# Patient Record
Sex: Female | Born: 1990 | Hispanic: Yes | Marital: Married | Smoking: Never smoker
Health system: Southern US, Community
[De-identification: ages and names within clinical notes are randomized; demographics above are authoritative.]

## PROBLEM LIST (undated history)

## (undated) ENCOUNTER — Inpatient Hospital Stay (HOSPITAL_COMMUNITY): Payer: Self-pay

## (undated) ENCOUNTER — Inpatient Hospital Stay (HOSPITAL_COMMUNITY): Payer: 59

## (undated) DIAGNOSIS — B999 Unspecified infectious disease: Secondary | ICD-10-CM

## (undated) DIAGNOSIS — D62 Acute posthemorrhagic anemia: Secondary | ICD-10-CM

## (undated) DIAGNOSIS — F329 Major depressive disorder, single episode, unspecified: Secondary | ICD-10-CM

## (undated) DIAGNOSIS — F32A Depression, unspecified: Secondary | ICD-10-CM

## (undated) HISTORY — PX: MOLE REMOVAL: SHX2046

## (undated) HISTORY — DX: Major depressive disorder, single episode, unspecified: F32.9

## (undated) HISTORY — DX: Depression, unspecified: F32.A

## (undated) HISTORY — DX: Unspecified infectious disease: B99.9

---

## 2009-06-03 ENCOUNTER — Inpatient Hospital Stay (HOSPITAL_COMMUNITY): Admission: AD | Admit: 2009-06-03 | Discharge: 2009-06-03 | Payer: Self-pay | Admitting: Obstetrics & Gynecology

## 2009-06-05 ENCOUNTER — Inpatient Hospital Stay (HOSPITAL_COMMUNITY): Admission: AD | Admit: 2009-06-05 | Discharge: 2009-06-07 | Payer: Self-pay | Admitting: Obstetrics & Gynecology

## 2009-06-10 ENCOUNTER — Emergency Department (HOSPITAL_COMMUNITY): Admission: EM | Admit: 2009-06-10 | Discharge: 2009-06-10 | Payer: Self-pay | Admitting: Emergency Medicine

## 2010-06-25 ENCOUNTER — Encounter (INDEPENDENT_AMBULATORY_CARE_PROVIDER_SITE_OTHER): Payer: Self-pay | Admitting: Internal Medicine

## 2010-06-25 LAB — CONVERTED CEMR LAB
CO2: 29 meq/L (ref 19–32)
Cholesterol: 140 mg/dL (ref 0–200)
DHEA-SO4: 181 ug/dL (ref 35–430)
Eosinophils Relative: 3 % (ref 0–5)
Glucose, Bld: 80 mg/dL (ref 70–99)
HCT: 40 % (ref 36.0–46.0)
Hemoglobin: 13.2 g/dL (ref 12.0–15.0)
Lymphocytes Relative: 35 % (ref 12–46)
Lymphs Abs: 2.3 10*3/uL (ref 0.7–4.0)
Monocytes Absolute: 0.5 10*3/uL (ref 0.1–1.0)
Preg, Serum: NEGATIVE
RBC: 4.36 M/uL (ref 3.87–5.11)
Sodium: 138 meq/L (ref 135–145)
Testosterone: 48.98 ng/dL (ref 10–70)
Total Bilirubin: 0.4 mg/dL (ref 0.3–1.2)
Total Protein: 7.6 g/dL (ref 6.0–8.3)
Triglycerides: 35 mg/dL (ref <150)
VLDL: 7 mg/dL (ref 0–40)
WBC: 6.4 10*3/uL (ref 4.0–10.5)

## 2010-07-09 ENCOUNTER — Ambulatory Visit (HOSPITAL_COMMUNITY)
Admission: RE | Admit: 2010-07-09 | Discharge: 2010-07-09 | Payer: Self-pay | Source: Home / Self Care | Admitting: Internal Medicine

## 2010-11-25 LAB — CBC
HCT: 35.2 % — ABNORMAL LOW (ref 36.0–46.0)
HCT: 37.6 % (ref 36.0–46.0)
Hemoglobin: 11.9 g/dL — ABNORMAL LOW (ref 12.0–15.0)
MCHC: 33.6 g/dL (ref 30.0–36.0)
MCHC: 33.8 g/dL (ref 30.0–36.0)
MCHC: 33.8 g/dL (ref 30.0–36.0)
MCHC: 34.7 g/dL (ref 30.0–36.0)
MCV: 92.6 fL (ref 78.0–100.0)
MCV: 92.8 fL (ref 78.0–100.0)
MCV: 93.6 fL (ref 78.0–100.0)
Platelets: 217 10*3/uL (ref 150–400)
Platelets: 255 10*3/uL (ref 150–400)
RBC: 3.67 MIL/uL — ABNORMAL LOW (ref 3.87–5.11)
RBC: 3.76 MIL/uL — ABNORMAL LOW (ref 3.87–5.11)
RBC: 3.8 MIL/uL — ABNORMAL LOW (ref 3.87–5.11)
RDW: 12.6 % (ref 11.5–15.5)
RDW: 12.7 % (ref 11.5–15.5)
RDW: 13 % (ref 11.5–15.5)
WBC: 11.2 10*3/uL — ABNORMAL HIGH (ref 4.0–10.5)

## 2010-11-25 LAB — COMPREHENSIVE METABOLIC PANEL
ALT: 13 U/L (ref 0–35)
AST: 19 U/L (ref 0–37)
CO2: 28 mEq/L (ref 19–32)
Calcium: 9.4 mg/dL (ref 8.4–10.5)
Creatinine, Ser: 0.56 mg/dL (ref 0.4–1.2)
GFR calc Af Amer: >60 mL/min (ref >60)
GFR calc non Af Amer: >60 mL/min (ref >60)
Sodium: 136 mEq/L (ref 135–145)
Total Protein: 6.7 g/dL (ref 6.0–8.3)

## 2010-11-25 LAB — DIFFERENTIAL
Basophils Absolute: 0 10*3/uL (ref 0.0–0.1)
Basophils Relative: 0 % (ref 0–1)
Basophils Relative: 1 % (ref 0–1)
Eosinophils Absolute: 1.2 10*3/uL — ABNORMAL HIGH (ref 0.0–0.7)
Eosinophils Absolute: 1.5 10*3/uL — ABNORMAL HIGH (ref 0.0–0.7)
Eosinophils Relative: 11 % — ABNORMAL HIGH (ref 0–5)
Eosinophils Relative: 9 % — ABNORMAL HIGH (ref 0–5)
Lymphocytes Relative: 16 % (ref 12–46)
Lymphocytes Relative: 21 % (ref 12–46)
Lymphocytes Relative: 25 % (ref 12–46)
Lymphs Abs: 2.4 10*3/uL (ref 0.7–4.0)
Lymphs Abs: 2.5 10*3/uL (ref 0.7–4.0)
Lymphs Abs: 2.9 10*3/uL (ref 0.7–4.0)
Monocytes Absolute: 0.9 10*3/uL (ref 0.1–1.0)
Monocytes Relative: 7 % (ref 3–12)
Monocytes Relative: 8 % (ref 3–12)
Monocytes Relative: 9 % (ref 3–12)
Neutro Abs: 5.9 10*3/uL (ref 1.7–7.7)
Neutro Abs: 7.5 10*3/uL (ref 1.7–7.7)
Neutrophils Relative %: 53 % (ref 43–77)

## 2010-11-25 LAB — BASIC METABOLIC PANEL
BUN: 7 mg/dL (ref 6–23)
CO2: 24 mEq/L (ref 19–32)
Calcium: 9.5 mg/dL (ref 8.4–10.5)
GFR calc non Af Amer: >60 mL/min (ref >60)
Glucose, Bld: 84 mg/dL (ref 70–99)

## 2010-11-25 LAB — URINALYSIS, ROUTINE W REFLEX MICROSCOPIC
Bilirubin Urine: NEGATIVE
Glucose, UA: NEGATIVE mg/dL
Ketones, ur: >80 mg/dL — AB
Nitrite: NEGATIVE
Nitrite: NEGATIVE
Protein, ur: NEGATIVE mg/dL
Specific Gravity, Urine: 1.015 (ref 1.005–1.030)
Urobilinogen, UA: 1 mg/dL (ref 0.0–1.0)
pH: 6 (ref 5.0–8.0)
pH: 7.5 (ref 5.0–8.0)

## 2010-11-25 LAB — GC/CHLAMYDIA PROBE AMP, GENITAL
Chlamydia, DNA Probe: NEGATIVE
GC Probe Amp, Genital: NEGATIVE

## 2010-11-25 LAB — URINE MICROSCOPIC-ADD ON

## 2010-11-25 LAB — WET PREP, GENITAL: Trich, Wet Prep: NONE SEEN

## 2010-11-25 LAB — URINE CULTURE
Colony Count: >=100000
Colony Count: NO GROWTH
Culture: NO GROWTH

## 2010-11-25 LAB — HCG, QUANTITATIVE, PREGNANCY: hCG, Beta Chain, Quant, S: 24637 m[IU]/mL — ABNORMAL HIGH (ref <5)

## 2013-12-30 ENCOUNTER — Inpatient Hospital Stay (HOSPITAL_COMMUNITY)
Admission: AD | Admit: 2013-12-30 | Discharge: 2013-12-30 | Disposition: A | Discharge status: Home / Self Care | Payer: 59 | Source: Ambulatory Visit | Attending: Family Medicine | Admitting: Family Medicine

## 2013-12-30 ENCOUNTER — Encounter (HOSPITAL_COMMUNITY): Payer: Self-pay | Admitting: *Deleted

## 2013-12-30 DIAGNOSIS — Z3202 Encounter for pregnancy test, result negative: Secondary | ICD-10-CM

## 2013-12-30 DIAGNOSIS — R1032 Left lower quadrant pain: Secondary | ICD-10-CM | POA: Insufficient documentation

## 2013-12-30 LAB — HCG, QUANTITATIVE, PREGNANCY: hCG, Beta Chain, Quant, S: <1 m[IU]/mL (ref <5)

## 2013-12-30 LAB — POCT PREGNANCY, URINE: Preg Test, Ur: NEGATIVE

## 2013-12-30 NOTE — MAU Provider Note (Signed)
Ms. Brooke Howard is a 23 y.o. female who presents to MAU today for a pregnancy test. The patient states that she has Mirena IUD. She takes HPT every few months because she does not have periods with IUD. She states that she took HPT this week and it was positive. She went to PCP and had negative UPT, but complained of mild LLQ cramping. She was sent there for further evaluation. She states mild pain off and on. She denies vaginal discharge, bleeding or UTI symptoms. She declines pelvic exam today.   BP 122/73  Pulse 89  Temp(Src) 98.3 F (36.8 C) (Oral)  Resp 16  Ht 5\' 4"  (1.626 m)  Wt 141 lb 12.8 oz (64.32 kg)  BMI 24.33 kg/m2 GENERAL: Well-developed, well-nourished female in no acute distress.  HEENT: Normocephalic, atraumatic.   LUNGS: Effort normal HEART: Regular rate  SKIN: Warm, dry and without erythema PSYCH: Normal mood and affect  Results for orders placed during the hospital encounter of 12/30/13 (from the past 24 hour(s))  HCG, QUANTITATIVE, PREGNANCY     Status: None   Collection Time    12/30/13  8:45 PM      Result Value Ref Range   hCG, Beta Chain, Quant, S <1  <5 mIU/mL  POCT PREGNANCY, URINE     Status: None   Collection Time    12/30/13  8:48 PM      Result Value Ref Range   Preg Test, Ur NEGATIVE  NEGATIVE   MDM Patient offered further evaluation of lower abdominal cramping. Declines at this time.   A: Negative pregnancy test  P: Discharge home Follow-up with PCP if abdominal pain continues Patient may take Ibuprofen PRN for pain Patient may return to MAU as needed or if her condition were to change or worsen  Freddi StarrJulie N Ethier, PA-C 12/30/2013 9:50 PM

## 2013-12-30 NOTE — Discharge Instructions (Signed)
Pregnancy Tests  HOW DO PREGNANCY TESTS WORK?  All pregnancy tests look for a special hormone in the urine or blood that is only present in pregnant women. This hormone, human chorionic gonadotropin (hCG), is also called the pregnancy hormone.   WHAT IS THE DIFFERENCE BETWEEN A URINE AND A BLOOD PREGNANCY TEST? IS ONE BETTER THAN THE OTHER?  There are two types of pregnancy tests.  · Blood tests.  · Urine tests.  Both tests look for the presence of hCG, the pregnancy hormone. Many women use a urine test or home pregnancy test (HPT) to find out if they are pregnant. HPTs are cheap, easy to use, can be done at home, and are private. When a woman has a positive result on an HPT, she needs to see her caregiver right away. The caregiver can confirm a positive HPT result with another urine test, a blood test, ultrasound, and a pelvic exam.   There are two types of blood tests you can get from a caregiver.   · A quantitative blood test (or the beta hCG test). This test measures the exact amount of hCG in the blood. This means it can pick up very small amounts of hCG, making it a very accurate test.  · A qualitative hCG blood test. This test gives a simple yes or no answer to whether you are pregnant. This test is more like a urine test in terms of its accuracy.  Blood tests can pick up hCG earlier in a pregnancy than urine tests can. Blood tests can tell if you are pregnant about 6 to 8 days after you release an egg from an ovary (ovulate). Urine tests can determine pregnancy about 2 weeks after ovulation.   HOW IS A HOME PREGNANCY TEST DONE?   There are many types of home pregnancy tests or HPTs that can be bought over-the-counter at drug or discount stores.   · Some involve collecting your urine in a cup and dipping a stick into the urine or putting some of the urine into a special container with an eyedropper.  · Others are done by placing a stick into your urine stream.  · Tests vary in how long you need to wait for  the stick or container to turn a certain color or have a symbol on it (like a plus or a minus).  · All tests come with written instructions. Most tests also have toll-free phone numbers to call if you have any questions about how to do the test or read the results.  HOW ACCURATE ARE HOME PREGNANCY TESTS?   HPTs are very accurate. Most brands of HPTs say they are 97% to 99% accurate when taken 1 week after missing your menstrual period, but this can vary with actual use. Each brand varies in how sensitive it is in picking up the pregnancy hormone hCG. If a test is not done correctly, it will be less accurate. Always check the package to make sure it is not past its expiration date. If it is, it will not be accurate. Most brands of HPTs tell users to do the test again in a few days, no matter what the results.   If you use an HPT too early in your pregnancy, you may not have enough of the pregnancy hormone hCG in your urine to have a positive test result. Most HPTs will be accurate if you test yourself around the time your period is due (about 2 weeks after you ovulate). You can   get a negative test result if you are not pregnant or if you ovulated later than you thought you did. You may also have problems with the pregnancy, which affects the amount of hCG you have in your urine. If your HPT is negative, test yourself again within a few days to 1 week. If you keep getting a negative result and think you are pregnant, talk with your caregiver right away about getting a blood pregnancy test.   FALSE POSITIVE PREGNANCY TEST  A false positive HPT can happen if there is blood or protein present in your urine. A false positive can also happen if you were recently pregnant or if you take a pregnancy test too soon after taking fertility drug that contains hCG. Also, some prescription medicines such as water pills (diuretics), tranquilizers, seizure medicines, psychiatric medicines, and allergy and nausea medicines  (promethazine) give false positive readings.  FALSE NEGATIVE PREGNANCY TEST  · A false negative HPT can happen if you do the test too early. Try to wait until you are at least 1 day late for your menstrual period.  · It may happen if you wait too long to test the urine (longer than 15 minutes).  · It may also happen if the urine is too diluted because you drank a lot of fluids before getting the urine sample. It is best to test the first morning urine after you get out of bed.  If your menstrual period did not start after a week of a negative HPT, repeat the pregnancy test.  CAN ANYTHING INTERFERE WITH HOME PREGNANCY TEST RESULTS?   Most medicines, both over-the-counter and prescription drugs, including birth control pills and antibiotics, should not affect the results of a HPT. Only those drugs that have the pregnancy hormone hCG in them can give a false positive test result. Drugs that have hCG in them may be used for treating infertility (not being able to get pregnant). Alcohol and illegal drugs do not affect HPT results, but you should not be using these substances if you are trying to get pregnant. If you have a positive pregnancy test, call your caregiver to make an appointment to begin prenatal care.  Document Released: 08/11/2003 Document Revised: 10/31/2011 Document Reviewed: 10/22/2010  ExitCare® Patient Information ©2014 [employer].

## 2013-12-30 NOTE — MAU Provider Note (Signed)
Attestation of Attending Supervision of Advanced Practitioner (PA/CNM/NP): Evaluation and management procedures were performed by the Advanced Practitioner under my supervision and collaboration.  I have reviewed the Advanced Practitioner's note and chart, and I agree with the management and plan.  Reva Boresanya S Delva Derden, MD [facility] Attending 12/30/2013 11:50 PM

## 2013-12-30 NOTE — MAU Note (Signed)
Pt has mirena IUD, +UPT at home and - UPT in the office today.  Pt having LLQ pain.  Denies bleeding.

## 2014-05-28 LAB — OB RESULTS CONSOLE ABO/RH: RH TYPE: POSITIVE

## 2014-05-28 LAB — OB RESULTS CONSOLE RPR: RPR: NONREACTIVE

## 2014-05-28 LAB — OB RESULTS CONSOLE HEPATITIS B SURFACE ANTIGEN: HEP B S AG: NEGATIVE

## 2014-05-28 LAB — OB RESULTS CONSOLE ANTIBODY SCREEN: Antibody Screen: NEGATIVE

## 2014-05-28 LAB — OB RESULTS CONSOLE HIV ANTIBODY (ROUTINE TESTING): HIV: NONREACTIVE

## 2014-05-28 LAB — OB RESULTS CONSOLE RUBELLA ANTIBODY, IGM: Rubella: IMMUNE

## 2014-06-12 LAB — OB RESULTS CONSOLE GC/CHLAMYDIA
Chlamydia: NEGATIVE
Gonorrhea: NEGATIVE

## 2014-08-22 NOTE — L&D Delivery Note (Signed)
Delivery Note At 4:58 PM a viable and healthy female was delivered via Vaginal, Spontaneous Delivery (Presentation: Left Occiput Anterior).  APGAR: 8, 9; weight 7 lb 11.1 oz (3490 g).   Placenta status: Intact, Spontaneous.  Cord: 3 vessels with the following complications: None.  Cord pH: na  Anesthesia: Epidural  Episiotomy: None Lacerations: 2nd degree Suture Repair: 2.0 vicryl rapide Est. Blood Loss (mL): 400  Mom to postpartum.  Baby to Couplet care / Skin to Skin.  Dajane Valli J 01/06/2015, 8:58 PM

## 2015-01-05 ENCOUNTER — Encounter (HOSPITAL_COMMUNITY): Payer: Self-pay | Admitting: *Deleted

## 2015-01-05 ENCOUNTER — Inpatient Hospital Stay (HOSPITAL_COMMUNITY)
Admission: AD | Admit: 2015-01-05 | Discharge: 2015-01-05 | Disposition: A | Discharge status: Home / Self Care | Payer: 59 | Source: Ambulatory Visit | Attending: Obstetrics & Gynecology | Admitting: Obstetrics & Gynecology

## 2015-01-05 DIAGNOSIS — Z3A39 39 weeks gestation of pregnancy: Secondary | ICD-10-CM | POA: Diagnosis present

## 2015-01-05 DIAGNOSIS — K219 Gastro-esophageal reflux disease without esophagitis: Secondary | ICD-10-CM | POA: Diagnosis present

## 2015-01-05 DIAGNOSIS — O9962 Diseases of the digestive system complicating childbirth: Principal | ICD-10-CM | POA: Diagnosis present

## 2015-01-05 DIAGNOSIS — O9903 Anemia complicating the puerperium: Secondary | ICD-10-CM | POA: Diagnosis present

## 2015-01-05 MED ORDER — OXYCODONE-ACETAMINOPHEN 5-325 MG PO TABS
2.0000 | ORAL_TABLET | Freq: Once | ORAL | Status: AC
  Administered 2015-01-05: 2 via ORAL
  Filled 2015-01-05: qty 2

## 2015-01-05 NOTE — Discharge Instructions (Signed)
Fetal Movement Counts °Patient Name: __________________________________________________ Patient Due Date: ____________________ °Performing a fetal movement count is highly recommended in high-risk pregnancies, but it is good for every pregnant woman to do. Your health care provider may ask you to start counting fetal movements at 28 weeks of the pregnancy. Fetal movements often increase: °· After eating a full meal. °· After physical activity. °· After eating or drinking something sweet or cold. °· At rest. °Pay attention to when you feel the baby is most active. This will help you notice a pattern of your baby's sleep and wake cycles and what factors contribute to an increase in fetal movement. It is important to perform a fetal movement count at the same time each day when your baby is normally most active.  °HOW TO COUNT FETAL MOVEMENTS °1. Find a quiet and comfortable area to sit or lie down on your left side. Lying on your left side provides the best blood and oxygen circulation to your baby. °2. Write down the day and time on a sheet of paper or in a journal. °3. Start counting kicks, flutters, swishes, rolls, or jabs in a 2-hour period. You should feel at least 10 movements within 2 hours. °4. If you do not feel 10 movements in 2 hours, wait 2-3 hours and count again. Look for a change in the pattern or not enough counts in 2 hours. °SEEK MEDICAL CARE IF: °· You feel less than 10 counts in 2 hours, tried twice. °· There is no movement in over an hour. °· The pattern is changing or taking longer each day to reach 10 counts in 2 hours. °· You feel the baby is not moving as he or she usually does. °Date: ____________ Movements: ____________ Start time: ____________ Finish time: ____________  °Date: ____________ Movements: ____________ Start time: ____________ Finish time: ____________ °Date: ____________ Movements: ____________ Start time: ____________ Finish time: ____________ °Date: ____________ Movements:  ____________ Start time: ____________ Finish time: ____________ °Date: ____________ Movements: ____________ Start time: ____________ Finish time: ____________ °Date: ____________ Movements: ____________ Start time: ____________ Finish time: ____________ °Date: ____________ Movements: ____________ Start time: ____________ Finish time: ____________ °Date: ____________ Movements: ____________ Start time: ____________ Finish time: ____________  °Date: ____________ Movements: ____________ Start time: ____________ Finish time: ____________ °Date: ____________ Movements: ____________ Start time: ____________ Finish time: ____________ °Date: ____________ Movements: ____________ Start time: ____________ Finish time: ____________ °Date: ____________ Movements: ____________ Start time: ____________ Finish time: ____________ °Date: ____________ Movements: ____________ Start time: ____________ Finish time: ____________ °Date: ____________ Movements: ____________ Start time: ____________ Finish time: ____________ °Date: ____________ Movements: ____________ Start time: ____________ Finish time: ____________  °Date: ____________ Movements: ____________ Start time: ____________ Finish time: ____________ °Date: ____________ Movements: ____________ Start time: ____________ Finish time: ____________ °Date: ____________ Movements: ____________ Start time: ____________ Finish time: ____________ °Date: ____________ Movements: ____________ Start time: ____________ Finish time: ____________ °Date: ____________ Movements: ____________ Start time: ____________ Finish time: ____________ °Date: ____________ Movements: ____________ Start time: ____________ Finish time: ____________ °Date: ____________ Movements: ____________ Start time: ____________ Finish time: ____________  °Date: ____________ Movements: ____________ Start time: ____________ Finish time: ____________ °Date: ____________ Movements: ____________ Start time: ____________ Finish  time: ____________ °Date: ____________ Movements: ____________ Start time: ____________ Finish time: ____________ °Date: ____________ Movements: ____________ Start time: ____________ Finish time: ____________ °Date: ____________ Movements: ____________ Start time: ____________ Finish time: ____________ °Date: ____________ Movements: ____________ Start time: ____________ Finish time: ____________ °Date: ____________ Movements: ____________ Start time: ____________ Finish time: ____________  °Date: ____________ Movements: ____________ Start time: ____________ Finish   time: ____________ °Date: ____________ Movements: ____________ Start time: ____________ Finish time: ____________ °Date: ____________ Movements: ____________ Start time: ____________ Finish time: ____________ °Date: ____________ Movements: ____________ Start time: ____________ Finish time: ____________ °Date: ____________ Movements: ____________ Start time: ____________ Finish time: ____________ °Date: ____________ Movements: ____________ Start time: ____________ Finish time: ____________ °Date: ____________ Movements: ____________ Start time: ____________ Finish time: ____________  °Date: ____________ Movements: ____________ Start time: ____________ Finish time: ____________ °Date: ____________ Movements: ____________ Start time: ____________ Finish time: ____________ °Date: ____________ Movements: ____________ Start time: ____________ Finish time: ____________ °Date: ____________ Movements: ____________ Start time: ____________ Finish time: ____________ °Date: ____________ Movements: ____________ Start time: ____________ Finish time: ____________ °Date: ____________ Movements: ____________ Start time: ____________ Finish time: ____________ °Date: ____________ Movements: ____________ Start time: ____________ Finish time: ____________  °Date: ____________ Movements: ____________ Start time: ____________ Finish time: ____________ °Date: ____________  Movements: ____________ Start time: ____________ Finish time: ____________ °Date: ____________ Movements: ____________ Start time: ____________ Finish time: ____________ °Date: ____________ Movements: ____________ Start time: ____________ Finish time: ____________ °Date: ____________ Movements: ____________ Start time: ____________ Finish time: ____________ °Date: ____________ Movements: ____________ Start time: ____________ Finish time: ____________ °Date: ____________ Movements: ____________ Start time: ____________ Finish time: ____________  °Date: ____________ Movements: ____________ Start time: ____________ Finish time: ____________ °Date: ____________ Movements: ____________ Start time: ____________ Finish time: ____________ °Date: ____________ Movements: ____________ Start time: ____________ Finish time: ____________ °Date: ____________ Movements: ____________ Start time: ____________ Finish time: ____________ °Date: ____________ Movements: ____________ Start time: ____________ Finish time: ____________ °Date: ____________ Movements: ____________ Start time: ____________ Finish time: ____________ °Document Released: 09/07/2006 Document Revised: 12/23/2013 Document Reviewed: 06/04/2012 °ExitCare® Patient Information ©2015 [employer]. This information is not intended to replace advice given to you by your health care provider. Make sure you discuss any questions you have with your health care provider. °Braxton Hicks Contractions °Contractions of the uterus can occur throughout pregnancy. Contractions are not always a sign that you are in labor.  °WHAT ARE BRAXTON HICKS CONTRACTIONS?  °Contractions that occur before labor are called Braxton Hicks contractions, or false labor. Toward the end of pregnancy (32-34 weeks), these contractions can develop more often and may become more forceful. This is not true labor because these contractions do not result in opening (dilatation) and thinning of the cervix. They  are sometimes difficult to tell apart from true labor because these contractions can be forceful and people have different pain tolerances. You should not feel embarrassed if you go to the hospital with false labor. Sometimes, the only way to tell if you are in true labor is for your health care provider to look for changes in the cervix. °If there are no prenatal problems or other health problems associated with the pregnancy, it is completely safe to be sent home with false labor and await the onset of true labor. °HOW CAN YOU TELL THE DIFFERENCE BETWEEN TRUE AND FALSE LABOR? °False Labor °· The contractions of false labor are usually shorter and not as hard as those of true labor.   °· The contractions are usually irregular.   °· The contractions are often felt in the front of the lower abdomen and in the groin.   °· The contractions may go away when you walk around or change positions while lying down.   °· The contractions get weaker and are shorter lasting as time goes on.   °· The contractions do not usually become progressively stronger, regular, and closer together as with true labor.   °True Labor °5. Contractions in true labor last 30-70 seconds, become   very regular, usually become more intense, and increase in frequency.   °6. The contractions do not go away with walking.   °7. The discomfort is usually felt in the top of the uterus and spreads to the lower abdomen and low back.   °8. True labor can be determined by your health care provider with an exam. This will show that the cervix is dilating and getting thinner.   °WHAT TO REMEMBER °· Keep up with your usual exercises and follow other instructions given by your health care provider.   °· Take medicines as directed by your health care provider.   °· Keep your regular prenatal appointments.   °· Eat and drink lightly if you think you are going into labor.   °· If Braxton Hicks contractions are making you uncomfortable:   °· Change your position from  lying down or resting to walking, or from walking to resting.   °· Sit and rest in a tub of warm water.   °· Drink 2-3 glasses of water. Dehydration may cause these contractions.   °· Do slow and deep breathing several times an hour.   °WHEN SHOULD I SEEK IMMEDIATE MEDICAL CARE? °Seek immediate medical care if: °· Your contractions become stronger, more regular, and closer together.   °· You have fluid leaking or gushing from your vagina.   °· You have a fever.   °· You pass blood-tinged mucus.   °· You have vaginal bleeding.   °· You have continuous abdominal pain.   °· You have low back pain that you never had before.   °· You feel your baby's head pushing down and causing pelvic pressure.   °· Your baby is not moving as much as it used to.   °Document Released: 08/08/2005 Document Revised: 08/13/2013 Document Reviewed: 05/20/2013 °ExitCare® Patient Information ©2015 [employer]. This information is not intended to replace advice given to you by your health care provider. Make sure you discuss any questions you have with your health care provider. ° °

## 2015-01-05 NOTE — Progress Notes (Signed)
Pt may be discharged home or walk and be rechecked per Dr. Juliene PinaMody

## 2015-01-05 NOTE — MAU Note (Signed)
Pt reports she is having ctx q 5 min. Denies SROM or leaking and reports good fetal movement

## 2015-01-05 NOTE — MAU Note (Signed)
Pt here for labor eval, states ctx's are stronger now. Was seen in MAU this am and was 1.5cm dilated. No bleeding or lof.

## 2015-01-05 NOTE — MAU Note (Signed)
Pt reports UC's since 1500, Denies LOF or Bleeding. +FM

## 2015-01-05 NOTE — Progress Notes (Signed)
Notified of pt's cervical exam unchanged, efm tracing reactive, orders to give pain meds and d/c home.

## 2015-01-05 NOTE — Discharge Instructions (Signed)
You do not need to go to your scheduled appointment this morning. If you have not gone into labor in the next few days, the office will call you to determine the plan of care.    Third Trimester of Pregnancy The third trimester is from week 29 through week 42, months 7 through 9. This trimester is when your unborn baby (fetus) is growing very fast. At the end of the ninth month, the unborn baby is about 20 inches in length. It weighs about 6-10 pounds.  HOME CARE   Avoid all smoking, herbs, and alcohol. Avoid drugs not approved by your doctor.  Only take medicine as told by your doctor. Some medicines are safe and some are not during pregnancy.  Exercise only as told by your doctor. Stop exercising if you start having cramps.  Eat regular, healthy meals.  Wear a good support bra if your breasts are tender.  Do not use hot tubs, steam rooms, or saunas.  Wear your seat belt when driving.  Avoid raw meat, uncooked cheese, and liter boxes and soil used by cats.  Take your prenatal vitamins.  Try taking medicine that helps you poop (stool softener) as needed, and if your doctor approves. Eat more fiber by eating fresh fruit, vegetables, and whole grains. Drink enough fluids to keep your pee (urine) clear or pale yellow.  Take warm water baths (sitz baths) to soothe pain or discomfort caused by hemorrhoids. Use hemorrhoid cream if your doctor approves.  If you have puffy, bulging veins (varicose veins), wear support hose. Raise (elevate) your feet for 15 minutes, 3-4 times a day. Limit salt in your diet.  Avoid heavy lifting, wear low heels, and sit up straight.  Rest with your legs raised if you have leg cramps or low back pain.  Visit your dentist if you have not gone during your pregnancy. Use a soft toothbrush to brush your teeth. Be gentle when you floss.  You can have sex (intercourse) unless your doctor tells you not to.  Do not travel far distances unless you must. Only do so  with your doctor's approval.  Take prenatal classes.  Practice driving to the hospital.  Pack your hospital bag.  Prepare the baby's room.  Go to your doctor visits. GET HELP IF:  You are not sure if you are in labor or if your water has broken.  You are dizzy.  You have mild cramps or pressure in your lower belly (abdominal).  You have a nagging pain in your belly area.  You continue to feel sick to your stomach (nauseous), throw up (vomit), or have watery poop (diarrhea).  You have bad smelling fluid coming from your vagina.  You have pain with peeing (urination). GET HELP RIGHT AWAY IF:   You have a fever.  You are leaking fluid from your vagina.  You are spotting or bleeding from your vagina.  You have severe belly cramping or pain.  You lose or gain weight rapidly.  You have trouble catching your breath and have chest pain.  You notice sudden or extreme puffiness (swelling) of your face, hands, ankles, feet, or legs.  You have not felt the baby move in over an hour.  You have severe headaches that do not go away with medicine.  You have vision changes. Document Released: 11/02/2009 Document Revised: 12/03/2012 Document Reviewed: 10/09/2012 [facility] Patient Information 2015 [employer] [place]. This information is not intended to replace advice given to you by your health care provider.  Make sure you discuss any questions you have with your health care provider. ° °

## 2015-01-06 ENCOUNTER — Inpatient Hospital Stay (HOSPITAL_COMMUNITY): Payer: 59 | Admitting: Anesthesiology

## 2015-01-06 ENCOUNTER — Inpatient Hospital Stay (HOSPITAL_COMMUNITY)
Admission: RE | Admit: 2015-01-06 | Discharge: 2015-01-08 | DRG: 775 | Disposition: A | Discharge status: Home / Self Care | Payer: 59 | Source: Ambulatory Visit | Attending: Obstetrics and Gynecology | Admitting: Obstetrics and Gynecology

## 2015-01-06 ENCOUNTER — Encounter (HOSPITAL_COMMUNITY): Payer: Self-pay | Admitting: *Deleted

## 2015-01-06 DIAGNOSIS — Z3403 Encounter for supervision of normal first pregnancy, third trimester: Secondary | ICD-10-CM | POA: Diagnosis present

## 2015-01-06 DIAGNOSIS — D509 Iron deficiency anemia, unspecified: Secondary | ICD-10-CM | POA: Diagnosis present

## 2015-01-06 DIAGNOSIS — D62 Acute posthemorrhagic anemia: Secondary | ICD-10-CM | POA: Diagnosis not present

## 2015-01-06 DIAGNOSIS — Z3A39 39 weeks gestation of pregnancy: Secondary | ICD-10-CM | POA: Diagnosis present

## 2015-01-06 DIAGNOSIS — IMO0001 Reserved for inherently not codable concepts without codable children: Secondary | ICD-10-CM

## 2015-01-06 DIAGNOSIS — O9903 Anemia complicating the puerperium: Secondary | ICD-10-CM | POA: Diagnosis present

## 2015-01-06 DIAGNOSIS — O99019 Anemia complicating pregnancy, unspecified trimester: Secondary | ICD-10-CM

## 2015-01-06 DIAGNOSIS — K219 Gastro-esophageal reflux disease without esophagitis: Secondary | ICD-10-CM | POA: Diagnosis present

## 2015-01-06 DIAGNOSIS — O9962 Diseases of the digestive system complicating childbirth: Secondary | ICD-10-CM | POA: Diagnosis present

## 2015-01-06 HISTORY — DX: Acute posthemorrhagic anemia: D62

## 2015-01-06 LAB — CBC
HCT: 31.1 % — ABNORMAL LOW (ref 36.0–46.0)
HEMOGLOBIN: 10.2 g/dL — AB (ref 12.0–15.0)
MCH: 27.3 pg (ref 26.0–34.0)
MCHC: 32.8 g/dL (ref 30.0–36.0)
MCV: 83.2 fL (ref 78.0–100.0)
Platelets: 266 10*3/uL (ref 150–400)
RBC: 3.74 MIL/uL — ABNORMAL LOW (ref 3.87–5.11)
RDW: 13.6 % (ref 11.5–15.5)
WBC: 10.9 10*3/uL — ABNORMAL HIGH (ref 4.0–10.5)

## 2015-01-06 LAB — ABO/RH: ABO/RH(D): B POS

## 2015-01-06 LAB — TYPE AND SCREEN
ABO/RH(D): B POS
Antibody Screen: NEGATIVE

## 2015-01-06 LAB — OB RESULTS CONSOLE GBS: STREP GROUP B AG: NEGATIVE

## 2015-01-06 LAB — RPR: RPR: NONREACTIVE

## 2015-01-06 MED ORDER — CITRIC ACID-SODIUM CITRATE 334-500 MG/5ML PO SOLN: 30.0000 mL | ORAL | Status: DC | PRN

## 2015-01-06 MED ORDER — DIPHENHYDRAMINE HCL 25 MG PO CAPS: 25.0000 mg | ORAL_CAPSULE | Freq: Four times a day (QID) | ORAL | Status: DC | PRN

## 2015-01-06 MED ORDER — LIDOCAINE HCL (PF) 1 % IJ SOLN
30.0000 mL | INTRAMUSCULAR | Status: DC | PRN
  Filled 2015-01-06: qty 30

## 2015-01-06 MED ORDER — OXYTOCIN 40 UNITS IN LACTATED RINGERS INFUSION - SIMPLE MED
62.5000 mL/h | INTRAVENOUS | Status: DC
  Administered 2015-01-06: 62.5 mL/h via INTRAVENOUS
  Filled 2015-01-06: qty 1000

## 2015-01-06 MED ORDER — ONDANSETRON HCL 4 MG/2ML IJ SOLN: 4.0000 mg | Freq: Four times a day (QID) | INTRAMUSCULAR | Status: DC | PRN

## 2015-01-06 MED ORDER — ACETAMINOPHEN 325 MG PO TABS: 650.0000 mg | ORAL_TABLET | ORAL | Status: DC | PRN

## 2015-01-06 MED ORDER — OXYCODONE-ACETAMINOPHEN 5-325 MG PO TABS: 1.0000 | ORAL_TABLET | ORAL | Status: DC | PRN

## 2015-01-06 MED ORDER — PRENATAL MULTIVITAMIN CH
1.0000 | ORAL_TABLET | Freq: Every day | ORAL | Status: DC
  Administered 2015-01-07 – 2015-01-08 (×2): 1 via ORAL
  Filled 2015-01-06 (×2): qty 1

## 2015-01-06 MED ORDER — ZOLPIDEM TARTRATE 5 MG PO TABS: 5.0000 mg | ORAL_TABLET | Freq: Every evening | ORAL | Status: DC | PRN

## 2015-01-06 MED ORDER — DIPHENHYDRAMINE HCL 50 MG/ML IJ SOLN: 12.5000 mg | INTRAMUSCULAR | Status: DC | PRN

## 2015-01-06 MED ORDER — BENZOCAINE-MENTHOL 20-0.5 % EX AERO
1.0000 "application " | INHALATION_SPRAY | CUTANEOUS | Status: DC | PRN
  Administered 2015-01-06: 1 via TOPICAL
  Filled 2015-01-06: qty 56

## 2015-01-06 MED ORDER — ACETAMINOPHEN 500 MG PO TABS
1000.0000 mg | ORAL_TABLET | ORAL | Status: AC
  Administered 2015-01-06: 1000 mg via ORAL
  Filled 2015-01-06: qty 2

## 2015-01-06 MED ORDER — FLEET ENEMA 7-19 GM/118ML RE ENEM: 1.0000 | ENEMA | RECTAL | Status: DC | PRN

## 2015-01-06 MED ORDER — WITCH HAZEL-GLYCERIN EX PADS: 1.0000 "application " | MEDICATED_PAD | CUTANEOUS | Status: DC | PRN

## 2015-01-06 MED ORDER — LACTATED RINGERS IV SOLN
500.0000 mL | INTRAVENOUS | Status: DC | PRN
  Administered 2015-01-06 (×2): 1000 mL via INTRAVENOUS

## 2015-01-06 MED ORDER — ONDANSETRON HCL 4 MG/2ML IJ SOLN: 4.0000 mg | INTRAMUSCULAR | Status: DC | PRN

## 2015-01-06 MED ORDER — EPHEDRINE 5 MG/ML INJ
10.0000 mg | INTRAVENOUS | Status: DC | PRN
  Filled 2015-01-06: qty 2

## 2015-01-06 MED ORDER — OXYCODONE-ACETAMINOPHEN 5-325 MG PO TABS: 2.0000 | ORAL_TABLET | ORAL | Status: DC | PRN

## 2015-01-06 MED ORDER — OXYTOCIN BOLUS FROM INFUSION
500.0000 mL | INTRAVENOUS | Status: DC
  Administered 2015-01-06: 500 mL via INTRAVENOUS

## 2015-01-06 MED ORDER — LANOLIN HYDROUS EX OINT: TOPICAL_OINTMENT | CUTANEOUS | Status: DC | PRN

## 2015-01-06 MED ORDER — IBUPROFEN 600 MG PO TABS
600.0000 mg | ORAL_TABLET | Freq: Four times a day (QID) | ORAL | Status: DC
  Administered 2015-01-06 – 2015-01-08 (×8): 600 mg via ORAL
  Filled 2015-01-06 (×8): qty 1

## 2015-01-06 MED ORDER — PHENYLEPHRINE 40 MCG/ML (10ML) SYRINGE FOR IV PUSH (FOR BLOOD PRESSURE SUPPORT)
80.0000 ug | PREFILLED_SYRINGE | INTRAVENOUS | Status: DC | PRN
  Filled 2015-01-06: qty 2
  Filled 2015-01-06: qty 20

## 2015-01-06 MED ORDER — SENNOSIDES-DOCUSATE SODIUM 8.6-50 MG PO TABS
2.0000 | ORAL_TABLET | ORAL | Status: DC
  Administered 2015-01-07 (×2): 2 via ORAL
  Filled 2015-01-06 (×2): qty 2

## 2015-01-06 MED ORDER — LACTATED RINGERS IV SOLN
INTRAVENOUS | Status: DC
  Administered 2015-01-06 (×2): via INTRAVENOUS

## 2015-01-06 MED ORDER — DIBUCAINE 1 % RE OINT: 1.0000 "application " | TOPICAL_OINTMENT | RECTAL | Status: DC | PRN

## 2015-01-06 MED ORDER — LIDOCAINE HCL (PF) 1 % IJ SOLN
INTRAMUSCULAR | Status: DC | PRN
  Administered 2015-01-06: 8 mL
  Administered 2015-01-06: 9 mL

## 2015-01-06 MED ORDER — IBUPROFEN 600 MG PO TABS
600.0000 mg | ORAL_TABLET | Freq: Four times a day (QID) | ORAL | Status: DC
Start: 2015-01-06 — End: 2015-01-06

## 2015-01-06 MED ORDER — LACTATED RINGERS IV BOLUS (SEPSIS)
500.0000 mL | Freq: Once | INTRAVENOUS | Status: AC
  Administered 2015-01-06: 1000 mL via INTRAVENOUS

## 2015-01-06 MED ORDER — SIMETHICONE 80 MG PO CHEW: 80.0000 mg | CHEWABLE_TABLET | ORAL | Status: DC | PRN

## 2015-01-06 MED ORDER — FENTANYL 2.5 MCG/ML BUPIVACAINE 1/10 % EPIDURAL INFUSION (WH - ANES)
14.0000 mL/h | INTRAMUSCULAR | Status: DC | PRN
  Administered 2015-01-06: 14 mL/h via EPIDURAL
  Filled 2015-01-06: qty 125

## 2015-01-06 MED ORDER — BUTORPHANOL TARTRATE 1 MG/ML IJ SOLN
1.0000 mg | INTRAMUSCULAR | Status: DC | PRN
  Administered 2015-01-06 (×3): 1 mg via INTRAVENOUS
  Filled 2015-01-06 (×3): qty 1

## 2015-01-06 MED ORDER — ONDANSETRON HCL 4 MG PO TABS: 4.0000 mg | ORAL_TABLET | ORAL | Status: DC | PRN

## 2015-01-06 MED ORDER — TETANUS-DIPHTH-ACELL PERTUSSIS 5-2.5-18.5 LF-MCG/0.5 IM SUSP: 0.5000 mL | Freq: Once | INTRAMUSCULAR | Status: DC

## 2015-01-06 MED ORDER — OXYTOCIN 40 UNITS IN LACTATED RINGERS INFUSION - SIMPLE MED
1.0000 m[IU]/min | INTRAVENOUS | Status: DC
  Administered 2015-01-06: 2 m[IU]/min via INTRAVENOUS

## 2015-01-06 NOTE — Progress Notes (Signed)
Dr mody called for update on pt status, updated on fhr, uc pattern, last SVE, to recheck pt and call her back

## 2015-01-06 NOTE — Anesthesia Procedure Notes (Signed)
Epidural Patient location during procedure: OB Start time: 01/06/2015 9:03 AM End time: 01/06/2015 9:07 AM  Staffing Anesthesiologist: Leilani AbleHATCHETT, Tyaire Odem Performed by: anesthesiologist   Preanesthetic Checklist Completed: patient identified, surgical consent, pre-op evaluation, timeout performed, IV checked, risks and benefits discussed and monitors and equipment checked  Epidural Patient position: sitting Prep: site prepped and draped and DuraPrep Patient monitoring: continuous pulse ox and blood pressure Approach: midline Location: L3-L4 Injection technique: LOR air  Needle:  Needle type: Tuohy  Needle gauge: 17 G Needle length: 9 cm and 9 Needle insertion depth: 6 cm Catheter type: closed end flexible Catheter size: 19 Gauge Catheter at skin depth: 11 cm Test dose: negative and Other  Assessment Sensory level: T9 Events: blood not aspirated, injection not painful, no injection resistance, negative IV test and no paresthesia  Additional Notes Reason for block:procedure for pain

## 2015-01-06 NOTE — Progress Notes (Signed)
Patient ID: Maryjean MornDaysi Hires, female   DOB: 10/31/1990, 24 y.o.   MRN: [id number]  Pt comfortable s/p epidural and got some rest.  FHT category I SVE 04/1004/-2-1/ OT but no caput or moulding, station improved since -3-2 this AM w AROM.  Pelvis borderline but needs decent time. Exaggerated Sims or Peanut call to assist rotation and decent Signing out to on call MD, pt informed., agrees.  V.Erminio Nygard, MD

## 2015-01-06 NOTE — H&P (Signed)
Brooke Howard is a 24 y.o. female  At 6839 wks dated by 1st trim sono, presenting with worsening contractions since this evening. Patient reports contractions since 2 days and was seen for labor check 5/15 night and 5/16 morning and send home since cervix was finger-tip dilated. Per MAU RN cervix 3 cm in MAU, so patient admitted for labor.  Denies LOF/ bleeding. Has good FMs.  [facility]- Dr Ernestina PennaFogleman/ [facility]. Healthy female, G1, uncomplicated pregnancy, 1st trim nausea/vomiting, GERD, 3rd trim anemia.   History OB History    Gravida Para Term Preterm AB TAB SAB Ectopic Multiple Living   1 0 0 0 0 0 0 0 0 0      Past Medical History  Diagnosis Date  . Medical history non-contributory    Past Surgical History  Procedure Laterality Date  . No past surgeries     Family History: family history is not on file. Social History:  reports that she has never smoked. She does not have any smokeless tobacco history on file. She reports that she does not drink alcohol or use illicit drugs.   Prenatal Transfer Tool  Maternal Diabetes: No Genetic Screening: Normal AFP4 normal Maternal Ultrasounds/Referrals: Normal Fetal Ultrasounds or other Referrals:  None Maternal Substance Abuse:  No Significant Maternal Medications:  Meds include: Zantac Significant Maternal Lab Results:  Lab values include: Group B Strep negative Other Comments:  None  ROS fatigue, exhaustion  Exam Physical Exam  BP 103/51 mmHg  Pulse 76  Temp(Src) 98.5 F (36.9 C) (Oral)  Resp 18  Ht 5\' 4"  (1.626 m)  Wt 177 lb (80.287 kg)  BMI 30.37 kg/m2  LMP 03/31/2014  A&O x 3, no acute distress. Pleasant HEENT neg, no thyromegaly Lungs CTA bilat CV RRR, S1S2 normal Abdo soft, non tender, non acute Extr no edema/ tenderness Pelvic 3/90%/-3/ BBOW, AROM done thin mec stained fluid noted. Pelvis with narrow subpubic arch - borderline FHT  140s/ + accels/ no decels/ mod variability- category I Toco regular, spontaneous every 3  minutes.   Prenatal labs: ABO, Rh: --/--/B POS, B POS (05/17 0100) Antibody: NEG (05/17 0100) Rubella: Immune (10/07 0000) RPR: Nonreactive (10/07 0000)  HBsAg: Negative (10/07 0000)  HIV: Non-reactive (10/07 0000)  GBS: Negative (05/17 0000)   Assessment/Plan: 24 yo G1 healthy female, at 39 wks admitted in early active labor at 3 cm after 2 days of prodromal labor, but no further dilatation since admission though she is more effaced and c/o more painful contractions. S/p Stadol x 3 doses. Hold more stadol doses.  Epidural ok once active cervical change noted, add pitocin as needed.  EFW 7.1/2 lbs. FHT-category I. Guarded for SVD due to borderline pelvis. Exaggerated Sims or sit up to assist with decent.   Istvan Behar R 01/06/2015, 8:36 AM

## 2015-01-06 NOTE — MAU Note (Signed)
Ctx all day. Denies LOF or VB. +FM.

## 2015-01-06 NOTE — Progress Notes (Signed)
S:  Some intermittent pressure - no pain      Comfortable with epidural  O:  VS: Blood pressure 95/57, pulse 173, temperature 100.2 F (37.9 C), temperature source Oral, resp. rate 18, height 5\' 4"  (1.626 m), weight 80.287 kg (177 lb), last menstrual period 03/31/2014, SpO2 98 %.        FHR : baseline 150 / variability moderate / accelerations + / no decelerations        Toco: contractions every 2-4 minutes / 30-50 / mild -moderate        Cervix : 10/100% / vtx 0 station with small caput - direct OA         Membranes: clear fluid        Foley draining dark amber urine  A: active labor - protracted     FHR category 1       Mild dehydration  P: IVF bolus for hydration     Tylenol 1000mg  po now     Pitocin augmentation to increase ctx intensity     Re-evaluate in 30-60 minutes or sooner if urge to push     Recheck temp 1 hour  Marlinda MikeBAILEY, Brooke Howard CNM, MSN, [facility]ACNM 01/06/2015, 3:30 PM

## 2015-01-06 NOTE — Anesthesia Preprocedure Evaluation (Signed)

## 2015-01-07 ENCOUNTER — Encounter (HOSPITAL_COMMUNITY): Payer: Self-pay | Admitting: Obstetrics and Gynecology

## 2015-01-07 DIAGNOSIS — D62 Acute posthemorrhagic anemia: Secondary | ICD-10-CM

## 2015-01-07 HISTORY — DX: Acute posthemorrhagic anemia: D62

## 2015-01-07 LAB — CBC
HEMATOCRIT: 25.2 % — AB (ref 36.0–46.0)
Hemoglobin: 8.3 g/dL — ABNORMAL LOW (ref 12.0–15.0)
MCH: 27.2 pg (ref 26.0–34.0)
MCHC: 32.9 g/dL (ref 30.0–36.0)
MCV: 82.6 fL (ref 78.0–100.0)
Platelets: 222 10*3/uL (ref 150–400)
RBC: 3.05 MIL/uL — ABNORMAL LOW (ref 3.87–5.11)
RDW: 13.8 % (ref 11.5–15.5)
WBC: 20.1 10*3/uL — ABNORMAL HIGH (ref 4.0–10.5)

## 2015-01-07 MED ORDER — POLYSACCHARIDE IRON COMPLEX 150 MG PO CAPS
150.0000 mg | ORAL_CAPSULE | Freq: Two times a day (BID) | ORAL | Status: DC
  Administered 2015-01-07 – 2015-01-08 (×3): 150 mg via ORAL
  Filled 2015-01-07 (×3): qty 1

## 2015-01-07 MED ORDER — MAGNESIUM OXIDE 400 (241.3 MG) MG PO TABS
400.0000 mg | ORAL_TABLET | Freq: Every day | ORAL | Status: DC
  Administered 2015-01-07 – 2015-01-08 (×2): 400 mg via ORAL
  Filled 2015-01-07 (×3): qty 1

## 2015-01-07 NOTE — Progress Notes (Signed)
Patient ID: Brooke Howard, female   DOB: 05/27/1991, 24 y.o.   MRN: [id number] PPD # 1 SVD  S:  Reports feeling well             Tolerating po/ No nausea or vomiting             Bleeding is light             Pain controlled with ibuprofen (OTC)             Up ad lib / ambulatory / voiding without difficulties    Newborn  Information for the patient's newborn:  Brooke Howard, Boy [name] [[id number]][[id number]]  female   breast feeding  / Circumcision planning   O:  A & O x 3, in no apparent distress              VS:  Filed Vitals:   01/06/15 1900 01/06/15 2003 01/07/15 0020 01/07/15 0607  BP: 113/62 99/51 105/58 103/60  Pulse: 109 98 102 90  Temp: 98.6 F (37 C) 98.4 F (36.9 C) 98.6 F (37 C) 98.8 F (37.1 C)  TempSrc: Oral Oral Oral Oral  Resp: 18 18 18 18   Height:      Weight:      SpO2: 100% 100%      LABS:   Recent Labs  01/06/15 0100 01/07/15 0526  WBC 10.9* 20.1*  HGB 10.2* 8.3*  HCT 31.1* 25.2*  PLT 266 222    Blood type: B POS (05/17 0100)  Rubella: Immune (10/07 0000)   I&O: I/O last 3 completed shifts: In: -  Out: 1176 [Urine:700; Blood:476]    Lungs: Clear and unlabored  Heart: regular rate and rhythm / no murmurs  Abdomen: soft, non-tender, non-distended              Fundus: firm, non-tender, U-1  Perineum: 2nd degree repair healing well, no edema  Lochia: minimal  Extremities: no edema, no calf pain or tenderness, no Homans    A/P: PPD # 1  24 y.o., G1P1001   Principal Problem:    Postpartum care following vaginal delivery (5/17) with 2nd degree laceration  Active Problems:    Acute Blood Loss Anemia   Doing well - stable status  Routine post partum orders  Start Niferex 150 mg BID  Start Magnesium Oxide 400 mg daily  Anticipate discharge tomorrow    Brooke Howard, Brooke Howard, M, MSN, CNM 01/07/2015, 2:33 PM

## 2015-01-07 NOTE — Lactation Note (Signed)
This note was copied from the chart of Brooke Maryjean MornDaysi Gunawan. Lactation Consultation Note  Mother stated she did not need interpreter. P1, Reviewed hand expression.  Mother expressed drops of colostrum. Assisted mother to latch baby in football hold on R side.  Sucks and some swallows observed.  LS8. Reviewed basics, cluster feeding.  Encouraged mother to wait for baby to open wide before latching. Mom encouraged to feed baby 8-12 times/24 hours and with feeding cues.  Mom made aware of O/P services, breastfeeding support groups, community resources, and our phone # for post-discharge questions.      Patient Name: Brooke Howard ZOXWR'UToday's Date: 01/07/2015 Reason for consult: Initial assessment   Maternal Data Has patient been taught Hand Expression?: Yes Does the patient have breastfeeding experience prior to this delivery?: No  Feeding Feeding Type: Breast Fed Length of feed: 20 min  LATCH Score/Interventions Latch: Grasps breast easily, tongue down, lips flanged, rhythmical sucking. Intervention(s): Assist with latch;Breast massage  Audible Swallowing: A few with stimulation  Type of Nipple: Everted at rest and after stimulation  Comfort (Breast/Nipple): Soft / non-tender     Hold (Positioning): Assistance needed to correctly position infant at breast and maintain latch.  LATCH Score: 8  Lactation Tools Discussed/Used     Consult Status Consult Status: Follow-up Date: 01/08/15 Follow-up type: In-patient    Dahlia ByesBerkelhammer, Brooke Howard 01/07/2015, 2:36 PM

## 2015-01-07 NOTE — Anesthesia Postprocedure Evaluation (Signed)
Anesthesia Post Note  Patient: Brooke Howard  Procedure(s) Performed: * No procedures listed *  Anesthesia type: Epidural  Patient location: Mother/Baby  Post pain: Pain level controlled  Post assessment: Post-op Vital signs reviewed  Last Vitals:  Filed Vitals:   01/07/15 0607  BP: 103/60  Pulse: 90  Temp: 37.1 C  Resp: 18    Post vital signs: Reviewed  Level of consciousness:alert  Complications: No apparent anesthesia complications

## 2015-01-08 DIAGNOSIS — O99019 Anemia complicating pregnancy, unspecified trimester: Secondary | ICD-10-CM

## 2015-01-08 DIAGNOSIS — D509 Iron deficiency anemia, unspecified: Secondary | ICD-10-CM | POA: Diagnosis present

## 2015-01-08 MED ORDER — MAGNESIUM OXIDE 400 (241.3 MG) MG PO TABS: 400.0000 mg | ORAL_TABLET | Freq: Every day | ORAL | Status: DC

## 2015-01-08 MED ORDER — POLYSACCHARIDE IRON COMPLEX 150 MG PO CAPS: 150.0000 mg | ORAL_CAPSULE | Freq: Two times a day (BID) | ORAL | Status: DC

## 2015-01-08 MED ORDER — IBUPROFEN 600 MG PO TABS
600.0000 mg | ORAL_TABLET | Freq: Four times a day (QID) | ORAL | Status: DC
Start: 2015-01-08 — End: 2016-06-15

## 2015-01-08 NOTE — Discharge Summary (Signed)
Obstetric Discharge Summary Reason for Admission: onset of labor Prenatal Procedures: IDA, GERD, uncomplicated Intrapartum Procedures: spontaneous vaginal delivery Postpartum Procedures: none Complications-Operative and Postpartum: 2nd degree perineal laceration HEMOGLOBIN  Date Value Ref Range Status  01/07/2015 8.3* 12.0 - 15.0 g/dL Final   HCT  Date Value Ref Range Status  01/07/2015 25.2* 36.0 - 46.0 % Final    Physical Exam:  General: alert and cooperative Lochia: appropriate Uterine Fundus: firm Incision: healing well, no significant drainage, no dehiscence, no significant erythema DVT Evaluation: No evidence of DVT seen on physical exam. Negative Homan's sign. No cords or calf tenderness. No significant calf/ankle edema.  Discharge Diagnoses: Term Pregnancy-delivered, 2nd degree perineal laceration, IDA with compounding ABL anemia  Discharge Information: Date: 01/08/2015 Activity: pelvic rest Diet: routine Medications: PNV, Ibuprofen, Iron and Mag Oxide Condition: stable Instructions: refer to practice specific booklet Discharge to: home Follow-up Information    [facility]. Schedule an appointment as soon as possible for a visit in 6 weeks.   Specialty:  Obstetrics and Gynecology   Contact information:   [address] Severe[address] [place] [id number] [phone]       Newborn Data: Live born female on 01/06/15 Birth Weight: 7 lb 11.1 oz (3490 g) APGAR: 8, 9  Home with mother.  Brooke Howard, N 01/08/2015, 9:16 AM

## 2015-01-08 NOTE — Lactation Note (Signed)
This note was copied from the chart of Brooke Maryjean MornDaysi Tallarico. Lactation Consultation Note  Patient Name: Brooke Howard BJYNW'GToday's Date: 01/08/2015 Reason for consult: Follow-up assessment Baby has been cluster feeding and Mom concerned if baby getting enough at the breast. Baby at 5% weight loss, adequate voids/stools per hours of age. Reviewed cluster feeding with Mom. Explained baby should be at the breast 8-12 times and with feeding ques. Reviewed to monitor voids/stools. Peds f/u tomorrow. Assisted Mom with positioning and obtaining good depth with latch, demonstrated how to bring bottom lip down for more depth. Baby demonstrated good suckling bursts with swallows noted.  Encouraged hand expression after feedings to encourage milk production and have EBM to give back to baby. Engorgement care reviewed if needed. Advised of OP services and support group.  Mom denies other questions/concerns. [employer] interpreter [phone] used for visit.   Maternal Data    Feeding Feeding Type: Breast Fed Length of feed: 30 min  LATCH Score/Interventions Latch: Grasps breast easily, tongue down, lips flanged, rhythmical sucking. Intervention(s): Adjust position;Assist with latch;Breast massage;Breast compression  Audible Swallowing: A few with stimulation  Type of Nipple: Everted at rest and after stimulation  Comfort (Breast/Nipple): Filling, red/small blisters or bruises, mild/mod discomfort  Problem noted: Filling  Hold (Positioning): Assistance needed to correctly position infant at breast and maintain latch. Intervention(s): Breastfeeding basics reviewed;Support Pillows;Position options  LATCH Score: 7  Lactation Tools Discussed/Used     Consult Status Consult Status: Complete Date: 01/08/15 Follow-up type: In-patient    Alfred LevinsGranger, Keevin Panebianco Ann 01/08/2015, 12:01 PM

## 2015-01-08 NOTE — Progress Notes (Signed)
PPD #2- SVD  Subjective:   Reports feeling well, ready for discharge Tolerating po/ No nausea or vomiting Bleeding is light Pain controlled with Motrin Up ad lib / ambulatory / voiding without problems Newborn: breastfeeding  / Circumcision: not planning   Objective:   VS: VS:  Filed Vitals:   01/07/15 0020 01/07/15 0607 01/07/15 1729 01/08/15 0648  BP: 105/58 103/60 118/67 98/47  Pulse: 102 90 90 68  Temp: 98.6 F (37 C) 98.8 F (37.1 C) 98.5 F (36.9 C) 97.6 F (36.4 C)  TempSrc: Oral Oral Oral Oral  Resp: 18 18 18 18   Height:      Weight:      SpO2:    97%    LABS:  Recent Labs  01/06/15 0100 01/07/15 0526  WBC 10.9* 20.1*  HGB 10.2* 8.3*  PLT 266 222   Blood type: --/--/B POS, B POS (05/17 0100) Rubella: Immune (10/07 0000)                I&O: Intake/Output      05/18 0701 - 05/19 0700 05/19 0701 - 05/20 0700   Urine (mL/kg/hr)     Blood     Total Output       Net              Physical Exam: Alert and oriented X3 Abdomen: soft, non-tender, non-distended  Fundus: firm, non-tender, U-2 Perineum: Well approximated, no significant erythema, edema, or drainage; healing well. Lochia: small Extremities: no edema, no calf pain or tenderness    Assessment: PPD #2  G1P1001/ S/P:spontaneous vaginal, 2nd degree laceration IDA with compounding ABL anemia Doing well - stable for discharge home   Plan: Discharge home RX's:  Ibuprofen 600mg  po Q 6 hrs prn pain #30 Refill x 0 Niferex 150mg  po BID #60 Refill x 1 Mag Oxide 200 mg po daily [facility] given    Donette LarryBHAMBRI, Ogden Handlin, N MSN, CNM 01/08/2015, 8:55 AM

## 2016-06-15 ENCOUNTER — Encounter (HOSPITAL_COMMUNITY): Payer: Self-pay

## 2016-06-15 ENCOUNTER — Inpatient Hospital Stay (HOSPITAL_COMMUNITY): Payer: Medicaid Other

## 2016-06-15 ENCOUNTER — Inpatient Hospital Stay (HOSPITAL_COMMUNITY)
Admission: AD | Admit: 2016-06-15 | Discharge: 2016-06-15 | Disposition: A | Discharge status: Home / Self Care | Payer: Medicaid Other | Source: Ambulatory Visit | Attending: Family Medicine | Admitting: Family Medicine

## 2016-06-15 DIAGNOSIS — O26891 Other specified pregnancy related conditions, first trimester: Secondary | ICD-10-CM

## 2016-06-15 DIAGNOSIS — N760 Acute vaginitis: Secondary | ICD-10-CM

## 2016-06-15 DIAGNOSIS — R12 Heartburn: Secondary | ICD-10-CM

## 2016-06-15 DIAGNOSIS — Z3A08 8 weeks gestation of pregnancy: Secondary | ICD-10-CM | POA: Diagnosis not present

## 2016-06-15 DIAGNOSIS — O210 Mild hyperemesis gravidarum: Secondary | ICD-10-CM | POA: Insufficient documentation

## 2016-06-15 DIAGNOSIS — O23591 Infection of other part of genital tract in pregnancy, first trimester: Secondary | ICD-10-CM | POA: Insufficient documentation

## 2016-06-15 DIAGNOSIS — B9689 Other specified bacterial agents as the cause of diseases classified elsewhere: Secondary | ICD-10-CM

## 2016-06-15 DIAGNOSIS — O219 Vomiting of pregnancy, unspecified: Secondary | ICD-10-CM | POA: Diagnosis not present

## 2016-06-15 DIAGNOSIS — R109 Unspecified abdominal pain: Secondary | ICD-10-CM

## 2016-06-15 DIAGNOSIS — R1010 Upper abdominal pain, unspecified: Secondary | ICD-10-CM | POA: Diagnosis present

## 2016-06-15 DIAGNOSIS — Z3491 Encounter for supervision of normal pregnancy, unspecified, first trimester: Secondary | ICD-10-CM

## 2016-06-15 DIAGNOSIS — O26899 Other specified pregnancy related conditions, unspecified trimester: Secondary | ICD-10-CM

## 2016-06-15 LAB — URINALYSIS, ROUTINE W REFLEX MICROSCOPIC
Bilirubin Urine: NEGATIVE
Glucose, UA: NEGATIVE mg/dL
Ketones, ur: NEGATIVE mg/dL
Nitrite: NEGATIVE
PH: 8 (ref 5.0–8.0)
Protein, ur: NEGATIVE mg/dL
SPECIFIC GRAVITY, URINE: 1.02 (ref 1.005–1.030)

## 2016-06-15 LAB — URINE MICROSCOPIC-ADD ON

## 2016-06-15 LAB — CBC
HEMATOCRIT: 37 % (ref 36.0–46.0)
HEMOGLOBIN: 12.9 g/dL (ref 12.0–15.0)
MCH: 30.9 pg (ref 26.0–34.0)
MCHC: 34.9 g/dL (ref 30.0–36.0)
MCV: 88.5 fL (ref 78.0–100.0)
Platelets: 264 10*3/uL (ref 150–400)
RBC: 4.18 MIL/uL (ref 3.87–5.11)
RDW: 13.6 % (ref 11.5–15.5)
WBC: 10.3 10*3/uL (ref 4.0–10.5)

## 2016-06-15 LAB — LIPASE, BLOOD: Lipase: 30 U/L (ref 11–51)

## 2016-06-15 LAB — WET PREP, GENITAL
SPERM: NONE SEEN
TRICH WET PREP: NONE SEEN
Yeast Wet Prep HPF POC: NONE SEEN

## 2016-06-15 LAB — HCG, QUANTITATIVE, PREGNANCY: hCG, Beta Chain, Quant, S: 98634 m[IU]/mL — ABNORMAL HIGH (ref <5)

## 2016-06-15 LAB — POCT PREGNANCY, URINE: Preg Test, Ur: POSITIVE — AB

## 2016-06-15 MED ORDER — PROMETHAZINE HCL 25 MG PO TABS: 12.5000 mg | ORAL_TABLET | Freq: Four times a day (QID) | ORAL | 2 refills | Status: DC | PRN

## 2016-06-15 MED ORDER — METRONIDAZOLE 500 MG PO TABS: 500.0000 mg | ORAL_TABLET | Freq: Two times a day (BID) | ORAL | 0 refills | Status: DC

## 2016-06-15 MED ORDER — RANITIDINE HCL 150 MG PO TABS: 150.0000 mg | ORAL_TABLET | Freq: Two times a day (BID) | ORAL | 2 refills | Status: DC

## 2016-06-15 MED ORDER — PROMETHAZINE HCL 25 MG PO TABS
25.0000 mg | ORAL_TABLET | Freq: Once | ORAL | Status: AC
  Administered 2016-06-15: 25 mg via ORAL
  Filled 2016-06-15: qty 1

## 2016-06-15 MED ORDER — GI COCKTAIL ~~LOC~~
30.0000 mL | ORAL | Status: AC
  Administered 2016-06-15: 30 mL via ORAL
  Filled 2016-06-15: qty 30

## 2016-06-15 NOTE — Discharge Instructions (Signed)
[facility] [employer] for [employer] at [facility]       Phone: [phone]  Center for [employer] at [place] Phone: [phone]  Center for [employer] at [place]  Phone: [phone]  Center for [employer] at [employer]  Phone: [phone]  Center for [facility] at [place]  Phone: [phone]  [facility]       Phone: [phone]  [facility]    Phone: [phone]   [facility] [place])    Phone: [phone]  [facility]    Phone: [phone]  [facility]    Phone: [phone]  [facility]    Phone: [phone]  [facility]       Phone: [phone]   [facility]  Phone: [phone]  [facility]    Phone: [phone]  [facility]   Phone: [phone]  [facility]      Phone: [phone]  [facility]    Phone: [phone]    Heartburn During Pregnancy Heartburn is a burning sensation in the chest caused by stomach acid backing up into the esophagus. Heartburn is common in pregnancy because a certain hormone (progesterone) is released when a woman is pregnant. The progesterone hormone may relax the valve that separates the esophagus from the stomach. This allows acid to go up into the esophagus, causing heartburn. Heartburn may also happen in pregnancy because the enlarging uterus pushes up on the stomach, which pushes more acid into the esophagus. This is especially true in the later stages of pregnancy. Heartburn problems usually go away after giving birth. CAUSES  Heartburn is caused by stomach acid backing up into the esophagus. During pregnancy, this may result from various things, including:   The progesterone  hormone.  Changing hormone levels.  The growing uterus pushing stomach acid upward.  Large meals.  Certain foods and drinks.  Exercise.  Increased acid production. SIGNS AND SYMPTOMS   Burning pain in the chest or lower throat.  Bitter taste in the mouth.  Coughing. DIAGNOSIS  Your health care provider will typically diagnose heartburn by taking a careful history of your concern. Blood tests may be done to check for a certain type of bacteria that is associated with heartburn. Sometimes, heartburn is diagnosed by prescribing a heartburn medicine to see if the symptoms improve. In some cases, a procedure called an endoscopy may be done. In this procedure, a tube with a light and a camera on the end (endoscope) is used to examine the esophagus and the stomach. TREATMENT  Treatment will vary depending on the severity of your symptoms. Your health care provider may recommend:  Over-the-counter medicines (antacids, acid reducers) for mild heartburn.  Prescription medicines to decrease stomach acid or to protect your stomach lining.  Certain changes in your diet.  Elevating the head of your bed by putting blocks under the legs. This helps prevent stomach acid from backing up into the esophagus when you are lying down. HOME CARE INSTRUCTIONS   Only take over-the-counter or prescription medicines as directed by your health care provider.  Raise the head of your bed by putting blocks under the legs if instructed to do so by your health care provider. Sleeping with more pillows is not effective because it only changes the position of your head.  Do not exercise right after eating.  Avoid eating 2-3 hours  before bed. Do not lie down right after eating.  Eat small meals throughout the day instead of three large meals.  Identify foods and beverages that make your symptoms worse and avoid them. Foods you may want to avoid include:  Peppers.  Chocolate.  High-fat foods, including  fried foods.  Spicy foods.  Garlic and onions.  Citrus fruits, including oranges, grapefruit, lemons, and limes.  Food containing tomatoes or tomato products.  Mint.  Carbonated and caffeinated drinks.  Vinegar. SEEK MEDICAL CARE IF:  You have abdominal pain of any kind.  You feel burning in your upper abdomen or chest, especially after eating or lying down.  You have nausea and vomiting.  Your stomach feels upset after you eat. SEEK IMMEDIATE MEDICAL CARE IF:   You have severe chest pain that goes down your arm or into your jaw or neck.  You feel sweaty, dizzy, or light-headed.  You become short of breath.  You vomit blood.  You have difficulty or pain with swallowing.  You have bloody or black, tarry stools.  You have episodes of heartburn more than 3 times a week, for more than 2 weeks. MAKE SURE YOU:  Understand these instructions.  Will watch your condition.  Will get help right away if you are not doing well or get worse.   This information is not intended to replace advice given to you by your health care provider. Make sure you discuss any questions you have with your health care provider.   Document Released: 08/05/2000 Document Revised: 08/29/2014 Document Reviewed: 03/27/2013 Elsevier Interactive Patient Education 2016 Elsevier Inc.   Morning Sickness Morning sickness is when you feel sick to your stomach (nauseous) during pregnancy. This nauseous feeling may or may not come with vomiting. It often occurs in the morning but can be a problem any time of day. Morning sickness is most common during the first trimester, but it may continue throughout pregnancy. While morning sickness is unpleasant, it is usually harmless unless you develop severe and continual vomiting (hyperemesis gravidarum). This condition requires more intense treatment.  CAUSES  The cause of morning sickness is not completely known but seems to be related to normal hormonal changes  that occur in pregnancy. RISK FACTORS You are at greater risk if you:  Experienced nausea or vomiting before your pregnancy.  Had morning sickness during a previous pregnancy.  Are pregnant with more than one baby, such as twins. TREATMENT  Do not use any medicines (prescription, over-the-counter, or herbal) for morning sickness without first talking to your health care provider. Your health care provider may prescribe or recommend:  Vitamin B6 supplements.  Anti-nausea medicines.  The herbal medicine ginger. HOME CARE INSTRUCTIONS   Only take over-the-counter or prescription medicines as directed by your health care provider.  Taking multivitamins before getting pregnant can prevent or decrease the severity of morning sickness in most women.  Eat a piece of dry toast or unsalted crackers before getting out of bed in the morning.  Eat five or six small meals a day.  Eat dry and bland foods (rice, baked potato). Foods high in carbohydrates are often helpful.  Do not drink liquids with your meals. Drink liquids between meals.  Avoid greasy, fatty, and spicy foods.  Get someone to cook for you if the smell of any food causes nausea and vomiting.  If you feel nauseous after taking prenatal vitamins, take the vitamins at night or with a snack.  Snack on protein foods (nuts, yogurt,  cheese) between meals if you are hungry.  Eat unsweetened gelatins for desserts.  Wearing an acupressure wristband (worn for sea sickness) may be helpful.  Acupuncture may be helpful.  Do not smoke.  Get a humidifier to keep the air in your house free of odors.  Get plenty of fresh air. SEEK MEDICAL CARE IF:   Your home remedies are not working, and you need medicine.  You feel dizzy or lightheaded.  You are losing weight. SEEK IMMEDIATE MEDICAL CARE IF:   You have persistent and uncontrolled nausea and vomiting.  You pass out (faint). MAKE SURE YOU:  Understand these  instructions.  Will watch your condition.  Will get help right away if you are not doing well or get worse.   This information is not intended to replace advice given to you by your health care provider. Make sure you discuss any questions you have with your health care provider.   Document Released: 09/29/2006 Document Revised: 08/13/2013 Document Reviewed: 01/23/2013 Elsevier Interactive Patient Education [employer]2016 Elsevier Inc.

## 2016-06-15 NOTE — MAU Provider Note (Signed)
Chief Complaint: No chief complaint on file.   First Provider Initiated Contact with Patient 06/15/16 1446      SUBJECTIVE HPI: Brooke Howard is a 25 y.o. G2P1001 at 2378w6d by LMP who presents to maternity admissions reporting upper abdominal pain and n/v x 2 weeks. She does report some lower abdominal cramping and yellow vaginal discharge also that started in the last week. Her pain is constant, burning pain.  She has not tried any treatments for her symptoms, eating and vomiting make the pain worse, nothing makes it better.  She has not tried any treatments for her n/v or vaginal discharge. She has never had any of these symptoms before now.  She denies vaginal bleeding, vaginal itching/burning, urinary symptoms, h/a, dizziness, or fever/chills.     HPI  Past Medical History:  Diagnosis Date  . Acute blood loss anemia 01/07/2015  . Medical history non-contributory    Past Surgical History:  Procedure Laterality Date  . MOLE REMOVAL    . NO PAST SURGERIES     Social History   Social History  . Marital status: Married    Spouse name: N/A  . Number of children: N/A  . Years of education: N/A   Occupational History  . Not on file.   Social History Main Topics  . Smoking status: Never Smoker  . Smokeless tobacco: Never Used  . Alcohol use No  . Drug use: No  . Sexual activity: Yes    Birth control/ protection: None   Other Topics Concern  . Not on file   Social History Narrative  . No narrative on file   No current facility-administered medications on file prior to encounter.    Current Outpatient Prescriptions on File Prior to Encounter  Medication Sig Dispense Refill  . ibuprofen (ADVIL,MOTRIN) 600 MG tablet Take 1 tablet (600 mg total) by mouth every 6 (six) hours. (Patient not taking: Reported on 06/15/2016) 30 tablet 0  . iron polysaccharides (NIFEREX) 150 MG capsule Take 1 capsule (150 mg total) by mouth 2 (two) times daily. (Patient not taking: Reported on 06/15/2016)  60 capsule 1  . magnesium oxide (MAG-OX) 400 (241.3 MG) MG tablet Take 1 tablet (400 mg total) by mouth daily. (Patient not taking: Reported on 06/15/2016) 30 tablet 1   No Known Allergies  ROS:  Review of Systems  Constitutional: Negative for chills, fatigue and fever.  Respiratory: Negative for shortness of breath.   Cardiovascular: Negative for chest pain.  Gastrointestinal: Positive for abdominal pain, nausea and vomiting.  Genitourinary: Positive for pelvic pain and vaginal discharge. Negative for difficulty urinating, dysuria, flank pain, vaginal bleeding and vaginal pain.  Neurological: Negative for dizziness and headaches.  Psychiatric/Behavioral: Negative.      I have reviewed patient's Past Medical Hx, Surgical Hx, Family Hx, Social Hx, medications and allergies.   Physical Exam   Patient Vitals for the past 24 hrs:  BP Temp Temp src Pulse Resp SpO2 Height Weight  06/15/16 1235 105/63 98.3 F (36.8 C) Oral 81 17 100 % 5\' 1"  (1.549 m) 155 lb (70.3 kg)   Constitutional: Well-developed, well-nourished female in no acute distress.  Cardiovascular: normal rate Respiratory: normal effort GI: Abd soft, non-tender. Pos BS x 4 MS: Extremities nontender, no edema, normal ROM Neurologic: Alert and oriented x 4.  GU: Neg CVAT.  PELVIC EXAM: Cervix pink, visually closed, without lesion, large amount frothy yellow discharge, vaginal walls and external genitalia normal Bimanual exam: Cervix 0/long/high, firm, anterior, neg CMT, uterus  nontender, ~ 8 week size, adnexa without tenderness, enlargement, or mass   LAB RESULTS Results for orders placed or performed during the hospital encounter of 06/15/16 (from the past 24 hour(s))  Urinalysis, Routine w reflex microscopic (not at [facility])     Status: Abnormal   Collection Time: 06/15/16 12:30 PM  Result Value Ref Range   Color, Urine YELLOW YELLOW   APPearance HAZY (A) CLEAR   Specific Gravity, Urine 1.020 1.005 - 1.030   pH 8.0 5.0  - 8.0   Glucose, UA NEGATIVE NEGATIVE mg/dL   Hgb urine dipstick SMALL (A) NEGATIVE   Bilirubin Urine NEGATIVE NEGATIVE   Ketones, ur NEGATIVE NEGATIVE mg/dL   Protein, ur NEGATIVE NEGATIVE mg/dL   Nitrite NEGATIVE NEGATIVE   Leukocytes, UA SMALL (A) NEGATIVE  Urine microscopic-add on     Status: Abnormal   Collection Time: 06/15/16 12:30 PM  Result Value Ref Range   Squamous Epithelial / LPF 6-30 (A) NONE SEEN   WBC, UA 0-5 0 - 5 WBC/hpf   RBC / HPF 6-30 0 - 5 RBC/hpf   Bacteria, UA MANY (A) NONE SEEN   Urine-Other MUCOUS PRESENT   Pregnancy, urine POC     Status: Abnormal   Collection Time: 06/15/16 12:48 PM  Result Value Ref Range   Preg Test, Ur POSITIVE (A) NEGATIVE  CBC     Status: None   Collection Time: 06/15/16  1:15 PM  Result Value Ref Range   WBC 10.3 4.0 - 10.5 K/uL   RBC 4.18 3.87 - 5.11 MIL/uL   Hemoglobin 12.9 12.0 - 15.0 g/dL   HCT 16.1 09.6 - 04.5 %   MCV 88.5 78.0 - 100.0 fL   MCH 30.9 26.0 - 34.0 pg   MCHC 34.9 30.0 - 36.0 g/dL   RDW 40.9 81.1 - 91.4 %   Platelets 264 150 - 400 K/uL  hCG, quantitative, pregnancy     Status: Abnormal   Collection Time: 06/15/16  1:15 PM  Result Value Ref Range   hCG, Beta Chain, Quant, S 98,634 (H) <5 mIU/mL  Wet prep, genital     Status: Abnormal   Collection Time: 06/15/16  2:50 PM  Result Value Ref Range   Yeast Wet Prep HPF POC NONE SEEN NONE SEEN   Trich, Wet Prep NONE SEEN NONE SEEN   Clue Cells Wet Prep HPF POC PRESENT (A) NONE SEEN   WBC, Wet Prep HPF POC MANY (A) NONE SEEN   Sperm NONE SEEN        IMAGING US Ob Comp Less 14 Wks  Result Date: 06/15/2016 CLINICAL DATA:  25 year old pregnant female with abdominal and low back pain for 2 weeks with nausea and vomiting. Quantitative beta HCG [id number]. EDC by LMP: 01/19/2017, projecting to an expected gestational age of [redacted] weeks 6 days. EXAM: OBSTETRIC <14 WK [place] AND TRANSVAGINAL OB US TECHNIQUE: Both transabdominal and transvaginal ultrasound examinations were  performed for complete evaluation of the gestation as well as the maternal uterus, adnexal regions, and pelvic cul-de-sac. Transvaginal technique was performed to assess early pregnancy. COMPARISON:  No prior scans from this gestation. FINDINGS: Intrauterine gestational sac: Single intrauterine gestational sac appears normal in size, shape and position. Yolk sac:  Present. Embryo:  Present. Embryonic Cardiac Activity: Regular rate and rhythm. Embryonic Heart Rate: 137  bpm CRL:  11.0  mm   7 w   1 d  [place] EDC: 01/31/2017 Subchorionic hemorrhage: There is a small perigestational bleed involving less than 30% of the gestational sac circumference, measuring 2.3 x 1.5 x 1.1 cm. Maternal uterus/adnexae: Anteverted uterus with no uterine fibroids demonstrated. Left ovary measures 3.8 x 1.8 x 2.4 cm. Right ovary measures 3.9 x 2.1 x 2.3 cm and contains a corpus luteum. No suspicious ovarian or adnexal masses. No abnormal free fluid in the pelvis. IMPRESSION: 1. Single living intrauterine gestation at 7 weeks 1 day by crown-rump length, mildly discordant with the expected gestational age of [redacted] weeks 6 days, possibly due to an error in menstrual dating. Small perigestational bleed. Normal embryonic cardiac activity. Consider a follow-up obstetric scan in 4 weeks to assess appropriate fetal growth. 2. No suspicious ovarian or adnexal findings. Electronically Signed   By: Delbert Phenix M.D.   On: 06/15/2016 16:36   US Ob Transvaginal  Result Date: 06/15/2016 CLINICAL DATA:  25 year old pregnant female with abdominal and low back pain for 2 weeks with nausea and vomiting. Quantitative beta HCG [id number]. EDC by LMP: 01/19/2017, projecting to an expected gestational age of [redacted] weeks 6 days. EXAM: OBSTETRIC <14 WK [place] AND TRANSVAGINAL OB US TECHNIQUE: Both transabdominal and transvaginal ultrasound examinations were performed for complete evaluation of the gestation as well as the maternal uterus, adnexal regions, and  pelvic cul-de-sac. Transvaginal technique was performed to assess early pregnancy. COMPARISON:  No prior scans from this gestation. FINDINGS: Intrauterine gestational sac: Single intrauterine gestational sac appears normal in size, shape and position. Yolk sac:  Present. Embryo:  Present. Embryonic Cardiac Activity: Regular rate and rhythm. Embryonic Heart Rate: 137  bpm CRL:  11.0  mm   7 w   1 d                  [place] EDC: 01/31/2017 Subchorionic hemorrhage: There is a small perigestational bleed involving less than 30% of the gestational sac circumference, measuring 2.3 x 1.5 x 1.1 cm. Maternal uterus/adnexae: Anteverted uterus with no uterine fibroids demonstrated. Left ovary measures 3.8 x 1.8 x 2.4 cm. Right ovary measures 3.9 x 2.1 x 2.3 cm and contains a corpus luteum. No suspicious ovarian or adnexal masses. No abnormal free fluid in the pelvis. IMPRESSION: 1. Single living intrauterine gestation at 7 weeks 1 day by crown-rump length, mildly discordant with the expected gestational age of [redacted] weeks 6 days, possibly due to an error in menstrual dating. Small perigestational bleed. Normal embryonic cardiac activity. Consider a follow-up obstetric scan in 4 weeks to assess appropriate fetal growth. 2. No suspicious ovarian or adnexal findings. Electronically Signed   By: Delbert Phenix M.D.   On: 06/15/2016 16:36    MAU Management/MDM: Ordered labs and reviewed results.  [place] indicates IUP with small SCH.  Pt to f/u with early prenatal care.  Clinical exam and wet prep c/w BV, will treat with Flagyl 500 mg BID x 7 days. Rx for Phenergan 12.5-25 mg PO Q 6 hours PRN nausea.  Treatments in MAU included GI Cocktail and Phenergan 25 mg PO x 1 dose with improvement in abdominal pain and nausea. Pt stable at time of discharge.  ASSESSMENT 1. Nausea and vomiting during pregnancy prior to [redacted] weeks gestation   2. Abdominal pain affecting pregnancy   3. Normal IUP (intrauterine pregnancy) on prenatal ultrasound, first  trimester   4. BV (bacterial vaginosis)   5. Heartburn during pregnancy in first trimester     PLAN Discharge home   Medication List  STOP taking these medications   ibuprofen 600 MG tablet Commonly known as:  ADVIL,MOTRIN   iron polysaccharides 150 MG capsule Commonly known as:  NIFEREX   magnesium oxide 400 (241.3 Mg) MG tablet Commonly known as:  MAG-OX     TAKE these medications   acetaminophen 500 MG tablet Commonly known as:  TYLENOL Take 500 mg by mouth every 6 (six) hours as needed for headache.   metroNIDAZOLE 500 MG tablet Commonly known as:  FLAGYL Take 1 tablet (500 mg total) by mouth 2 (two) times daily.   prenatal multivitamin Tabs tablet Take 1 tablet by mouth daily at 12 noon.   promethazine 25 MG tablet Commonly known as:  PHENERGAN Take 0.5-1 tablets (12.5-25 mg total) by mouth every 6 (six) hours as needed for nausea.   ranitidine 150 MG tablet Commonly known as:  ZANTAC Take 1 tablet (150 mg total) by mouth 2 (two) times daily.      Follow-up Information    Prenatal provider of your choice .           Sharen Counter Certified Nurse-Midwife 06/15/2016  4:54 PM

## 2016-06-15 NOTE — MAU Note (Addendum)
Pt c/o stomachache, and back pain for the last two weeks. Pt states she hasn't been able to keep anything down for the last two weeks. Pt denies bleeding. Pt c/o a small amount of yellow discharge. Pt was told she was pregnant at the health department on [facility]. Pt c/o pain in her upper mid abdomen that has been going on for two weeks.

## 2016-06-16 LAB — HIV ANTIBODY (ROUTINE TESTING W REFLEX): HIV Screen 4th Generation wRfx: NONREACTIVE

## 2016-06-16 LAB — GC/CHLAMYDIA PROBE AMP (~~LOC~~) NOT AT ARMC
CHLAMYDIA, DNA PROBE: POSITIVE — AB
NEISSERIA GONORRHEA: POSITIVE — AB

## 2016-06-20 ENCOUNTER — Telehealth (HOSPITAL_COMMUNITY): Payer: Self-pay | Admitting: Obstetrics and Gynecology

## 2016-07-07 ENCOUNTER — Other Ambulatory Visit (HOSPITAL_COMMUNITY): Payer: Self-pay | Admitting: Nurse Practitioner

## 2016-07-07 DIAGNOSIS — Z3A12 12 weeks gestation of pregnancy: Secondary | ICD-10-CM

## 2016-07-07 DIAGNOSIS — Z3682 Encounter for antenatal screening for nuchal translucency: Secondary | ICD-10-CM

## 2016-07-07 LAB — OB RESULTS CONSOLE ABO/RH: RH TYPE: POSITIVE

## 2016-07-07 LAB — OB RESULTS CONSOLE RUBELLA ANTIBODY, IGM: Rubella: IMMUNE

## 2016-07-07 LAB — OB RESULTS CONSOLE ANTIBODY SCREEN: ANTIBODY SCREEN: NEGATIVE

## 2016-07-07 LAB — OB RESULTS CONSOLE RPR: RPR: NONREACTIVE

## 2016-07-07 LAB — OB RESULTS CONSOLE HEPATITIS B SURFACE ANTIGEN: HEP B S AG: NEGATIVE

## 2016-07-07 LAB — OB RESULTS CONSOLE VARICELLA ZOSTER ANTIBODY, IGG: Varicella: NON-IMMUNE/NOT IMMUNE

## 2016-07-07 LAB — SICKLE CELL SCREEN: Sickle Cell Screen: NORMAL

## 2016-07-18 ENCOUNTER — Encounter (HOSPITAL_COMMUNITY): Payer: Self-pay | Admitting: Nurse Practitioner

## 2016-07-19 NOTE — Telephone Encounter (Signed)
Attempted to call Pt. x4 to provide follow up on positive results.  Pt. Has not returned call. Health dept. Paper work completed and fax'd.

## 2016-07-22 ENCOUNTER — Encounter (HOSPITAL_COMMUNITY): Payer: Self-pay

## 2016-07-22 ENCOUNTER — Ambulatory Visit (HOSPITAL_COMMUNITY)
Admission: RE | Admit: 2016-07-22 | Discharge: 2016-07-22 | Disposition: A | Discharge status: Home / Self Care | Payer: Medicaid Other | Source: Ambulatory Visit | Attending: Nurse Practitioner | Admitting: Nurse Practitioner

## 2016-07-22 DIAGNOSIS — Z3A12 12 weeks gestation of pregnancy: Secondary | ICD-10-CM | POA: Diagnosis not present

## 2016-07-22 DIAGNOSIS — Z3682 Encounter for antenatal screening for nuchal translucency: Secondary | ICD-10-CM | POA: Diagnosis not present

## 2016-07-29 ENCOUNTER — Other Ambulatory Visit (HOSPITAL_COMMUNITY): Payer: Self-pay

## 2016-08-22 NOTE — L&D Delivery Note (Signed)
Delivery Note  Pt presented for IOL for A1GDM. Augmented with foley, cytotec, and arom shortly prior to delivery.  At 4:10 PM a viable female was delivered via Vaginal, Spontaneous Delivery (Presentation: oa  ).  APGAR: 9, 9; weight pending  .   Placenta status: intact.  Cord: 3v  with the following complications: none .  Cord pH: none  Anesthesia:  epidural Episiotomy: None Lacerations: None Est. Blood Loss (mL): 200  Mom to postpartum.  Baby to Couplet care / Skin to Skin.  Brooke Howard 01/31/2017, 4:48 PM

## 2016-08-29 LAB — OB RESULTS CONSOLE GC/CHLAMYDIA
Chlamydia: NEGATIVE
Gonorrhea: NEGATIVE

## 2016-08-29 LAB — CYSTIC FIBROSIS DIAGNOSTIC STUDY: Interpretation-CFDNA:: NEGATIVE

## 2016-12-06 ENCOUNTER — Encounter (HOSPITAL_COMMUNITY): Payer: Self-pay | Admitting: *Deleted

## 2016-12-06 ENCOUNTER — Inpatient Hospital Stay (HOSPITAL_COMMUNITY)
Admission: AD | Admit: 2016-12-06 | Discharge: 2016-12-06 | Disposition: A | Discharge status: Home / Self Care | Payer: Medicaid Other | Source: Ambulatory Visit | Attending: Family Medicine | Admitting: Family Medicine

## 2016-12-06 DIAGNOSIS — O9989 Other specified diseases and conditions complicating pregnancy, childbirth and the puerperium: Secondary | ICD-10-CM | POA: Diagnosis not present

## 2016-12-06 DIAGNOSIS — Z3A32 32 weeks gestation of pregnancy: Secondary | ICD-10-CM | POA: Insufficient documentation

## 2016-12-06 DIAGNOSIS — O212 Late vomiting of pregnancy: Secondary | ICD-10-CM | POA: Diagnosis present

## 2016-12-06 DIAGNOSIS — O98513 Other viral diseases complicating pregnancy, third trimester: Secondary | ICD-10-CM | POA: Insufficient documentation

## 2016-12-06 DIAGNOSIS — R52 Pain, unspecified: Secondary | ICD-10-CM | POA: Diagnosis not present

## 2016-12-06 DIAGNOSIS — A084 Viral intestinal infection, unspecified: Secondary | ICD-10-CM | POA: Diagnosis not present

## 2016-12-06 DIAGNOSIS — R197 Diarrhea, unspecified: Secondary | ICD-10-CM | POA: Diagnosis present

## 2016-12-06 LAB — URINALYSIS, ROUTINE W REFLEX MICROSCOPIC
BILIRUBIN URINE: NEGATIVE
GLUCOSE, UA: NEGATIVE mg/dL
KETONES UR: 20 mg/dL — AB
Leukocytes, UA: NEGATIVE
Nitrite: NEGATIVE
PROTEIN: 100 mg/dL — AB
Specific Gravity, Urine: 1.031 — ABNORMAL HIGH (ref 1.005–1.030)
pH: 5 (ref 5.0–8.0)

## 2016-12-06 MED ORDER — ONDANSETRON HCL 4 MG PO TABS: 4.0000 mg | ORAL_TABLET | Freq: Four times a day (QID) | ORAL | 0 refills | Status: DC

## 2016-12-06 MED ORDER — LACTATED RINGERS IV BOLUS (SEPSIS)
1000.0000 mL | Freq: Once | INTRAVENOUS | Status: AC
  Administered 2016-12-06: 1000 mL via INTRAVENOUS

## 2016-12-06 MED ORDER — ONDANSETRON HCL 4 MG/2ML IJ SOLN
4.0000 mg | Freq: Once | INTRAMUSCULAR | Status: AC
  Administered 2016-12-06: 4 mg via INTRAVENOUS
  Filled 2016-12-06: qty 2

## 2016-12-06 NOTE — Discharge Instructions (Signed)
Opciones de alimentos para ayudar a Actuary - Adultos (Food Choices to Help Relieve Diarrhea, Adult) Cuando se tiene diarrea, los alimentos que se ingieren y los hbitos de alimentacin son Engineer, production. Elegir los [employer] y las bebidas adecuados ayuda a Actuary. Adems, debido a que la diarrea puede durar [employer], debe reponer la prdida de lquidos y Customer service manager (como sodio, potasio y Editor, commissioning) a fin de ayudar a Statistician. QU PAUTAS GENERALES DEBO SEGUIR?  Beba lentamente 1 taza (8 onzas) de lquido por cada episodio de diarrea. Si bebe una cantidad de lquidos suficiente, la orina ser de tono claro o color amarillo plido.  Consuma alimentos con almidn. Algunas buenas opciones son arroz blanco, tostada blanca, pasta, cereales con bajo contenido de fibras, papas al horno (sin cscara), galletas saladas y panecillos.  Evite las porciones grandes de cualquier vegetal cocido.  Limite las frutas a dos porciones por da. Una porcin es  taza o un trozo pequeo.  Alimentos con menos de 2 g de fibra por porcin.  Limite las grasas a menos de 8 cucharaditas (38g) por Futures trader.  Evite las comidas fritas.  Consuma alimentos que contengan probiticos. Los probiticos se encuentran en ciertos productos lcteos.  Evite los alimentos y las bebidas que pueden aumentar la velocidad a la que el alimento se mueve a travs del estmago y de los intestinos (tracto gastrointestinal). Lo que debe evitar:  Alimentos ricos en fibra, como frutas secas, frutas y vegetales crudos, frutos secos, semillas, alimentos con cereales integrales.  Alimentos muy condimentados y con alto contenido de Neurosurgeon.  Alimentos y bebidas endulzados con jarabe de maz de alto contenido de fructosa, miel o alcoholes de International aid/development worker, como xilitol, sorbitol y manitol. QU ALIMENTOS SE RECOMIENDAN? Cereales Arroz blanco. Pan blanco, francs o pita (fresco o tostado), incluidos los  [place], los bollos y las rosquillas. Pastas blancas. Galletas de [place], [place] o [place]. Pretzels. Cereales con bajo contenido de Sara Lee cocidos en agua (como harina de maz, smola o crema de cereales). Muffins. Pan cimo Tostada Melba. [place]isc[place]. Vegetales Papas (sin cscara). Jugo de tomates o de vegetales Vegetales bien cocidos o enlatados sin semillas. Deatra James tierna. Frutas Pur de Fisher Scientific cocido o enlatado, damascos, cerezas, cctel de frutas, pomelos, duraznos, peras o ciruelas. Bananas frescas, manzanas sin cscara, cerezas, uvas, meln, pomelo, duraznos, naranjas o ciruelas. Carnes y otros productos con protenas Pollo al horno o hervido. Huevos. Tofu. Pescado. Mariscos. Mantequilla de man, sin trozos. Carne molida o un bife tierno bien cocido, jamn, ternera, cordero, cerdo o aves. Lcteos Yogur natural, kefir y Dentist bebible sin Paediatric nurse. Leche sin Advice worker, suero de [place] o [place] de soja. Queso duro comn. Bebidas Bebidas deportivas. Caldos claros. Jugos de fruta diluidos (excepto de ciruelas). Gaseosas sin cafena comunes, como gaseosa de [place]. Agua. Ts descafeinados. Soluciones de rehidratacin oral. Bebidas sin azcar no endulzadas con alcoholes de azcar. Otros Consom, caldo o sopas hechas con los alimentos recomendados. Los artculos mencionados arriba pueden no ser [employer]on de las bebidas o los alimentos recomendados. Comunquese con el nutricionista para conocer ms opciones. QU ALIMENTOS NO SE RECOMIENDAN? Cereales Cereales, galletas, pastas, panecillos y panes de cereales integrales, salvado o centeno. Arroz integral o arroz salvaje. Cereales con menos de 2 g de fibra por porcin. Tortillas de maz o tacos. Harina de avena cocida o seca. Granola. Palomitas de maz. Vegetales Vegetales crudos. Repollo, brcoli, repollitos de Bruselas, alcachofas, porotos, hojas de remolacha, maz, col rizada, legumbres, guisantes y  batatas. Cscara de papas.  [place] y repollo cocidos. Nils Pyle Frutas secas, incluidas las ciruelas y los dtiles. Frutas crudas. Compota o ciruelas secas. Manzanas frescas con cscara, damascos, mangos, peras, frambuesas y frutillas. Carnes y otros productos con protenas Mantequilla de man espesa. Frutos secos y semillas. Porotos y lentejas. Panceta. Lcteos Quesos con alto contenido de [place]. Leche, leche chocolatada y bebidas hechas con [place], como los batidos. Crema. Helados. Dulces y [employer], donas y pan dulce. Panqueques y waffles. Grasas y Barnes & Noble. Salsas a base de crema. Margarina. Aceites para ensaladas. Condimentos para ensaladas. Aceitunas. Agu[place]. Bebidas Bebidas con cafena (como caf, t, refrescos o bebidas energizantes). Bebidas alcohlicas. Jugos de frutas con pulpa. Jugo de ciruelas. Bebidas endulzadas con jarabe de maz de alto contenido de fructosa o alcoholes de International aid/development worker. Otros Coco. Salsa picante. [place] en polvo. [place]onesa. Salsas. Sopas a base de crema o de [place]. Los artculos mencionados arriba pueden no ser [employer]on de las bebidas y los alimentos que se Theatre stage manager. Comunquese con el nutricionista para recibir ms informacin. QU DEBO HACER SI ME DESHIDRATO? Algunas veces, la diarrea puede producir deshidratacin. Entre los signos de deshidratacin se incluyen la orina oscura y la boca y la piel secas. Si piensa que est deshidratado, debe rehidratarse con una solucin de rehidratacin oral. Estas soluciones se pueden comprar en las farmacias, en las tiendas minoristas o por Internet. Beba  o 1 taza (120-252ml) de solucin de rehidratacin oral cada vez que tenga un episodio de diarrea. Si beber esta cantidad empeora la diarrea, intente beber en cantidades ms pequeas con ms frecuencia. Por ejemplo, tomar 1-3 cucharaditas (5-60ml) cada 5-38minutos. Una regla general para mantenerse hidratado es beber 1  -2 litros de lquido Air cabin crew. Hable con el  mdico sobre la cantidad especfica que usted debe beber diariamente. Beba suficiente lquido para Photographer orina clara o de color amarillo plido. Esta informacin no tiene Theme park manager el consejo del mdico. Asegrese de hacerle al mdico cualquier pregunta que tenga. Document Released: 08/08/2005 Document Revised: 08/29/2014 Document Reviewed: 07/01/2013 Elsevier Interactive Patient Education  2017 [employer].

## 2016-12-06 NOTE — MAU Provider Note (Signed)
History     CSN: [id number]  Arrival date and time: 12/06/16 1150   First Provider Initiated Contact with Patient 12/06/16 1310      Chief Complaint  Patient presents with  . Emesis  . Diarrhea  . Generalized Body Aches   HPI Ms. Sharin Bannister is a 26 y.o. G2P1001 at [redacted]w[redacted]d who presents to MAU today with complaint of N/V/D x 3 days with some abdominal cramping and decreased fetal movement noted today. The patient denies vaginal bleeding, discharge, fever, LOF or regular contractions. She has not taken anything for pain or emesis. Her son had the same symptoms just before her symptoms started. She has been receiving prenatal care at the [facility] but has not been seen in the last month.   OB History    Gravida Para Term Preterm AB Living   0 0 1   SAB TAB Ectopic Multiple Live Births   0 0 0 0 1      Past Medical History:  Diagnosis Date  . Acute blood loss anemia 01/07/2015  . Medical history non-contributory     Past Surgical History:  Procedure Laterality Date  . MOLE REMOVAL    . NO PAST SURGERIES      History reviewed. No pertinent family history.  Social History  Substance Use Topics  . Smoking status: Never Smoker  . Smokeless tobacco: Never Used  . Alcohol use No    Allergies: No Known Allergies  No prescriptions prior to admission.    Review of Systems  Constitutional: Negative for fever.  Gastrointestinal: Positive for abdominal pain, diarrhea, nausea and vomiting. Negative for constipation.  Genitourinary: Negative for dysuria, frequency, urgency, vaginal bleeding and vaginal discharge.   Physical Exam   Blood pressure (!) 103/58, pulse 96, temperature 98.1 F (36.7 C), resp. rate 16, weight 168 lb 12 oz (76.5 kg), last menstrual period 04/14/2016, SpO2 100 %, unknown if currently breastfeeding.  Physical Exam  Nursing note and vitals reviewed. Constitutional: She is oriented to person, place, and time. She appears well-developed and  well-nourished. No distress.  HENT:  Head: Normocephalic and atraumatic.  Cardiovascular: Normal rate.   Respiratory: Effort normal.  GI: Soft. She exhibits no distension and no mass. There is no tenderness. There is no rebound and no guarding.  Neurological: She is alert and oriented to person, place, and time.  Skin: Skin is warm and dry. No erythema.  Psychiatric: She has a normal mood and affect.    Results for orders placed or performed during the hospital encounter of 12/06/16 (from the past 24 hour(s))  Urinalysis, Routine w reflex microscopic     Status: Abnormal   Collection Time: 12/06/16 12:14 PM  Result Value Ref Range   Color, Urine AMBER (A) YELLOW   APPearance CLOUDY (A) CLEAR   Specific Gravity, Urine 1.031 (H) 1.005 - 1.030   pH 5.0 5.0 - 8.0   Glucose, UA NEGATIVE NEGATIVE mg/dL   Hgb urine dipstick SMALL (A) NEGATIVE   Bilirubin Urine NEGATIVE NEGATIVE   Ketones, ur 20 (A) NEGATIVE mg/dL   Protein, ur 119 (A) NEGATIVE mg/dL   Nitrite NEGATIVE NEGATIVE   Leukocytes, UA NEGATIVE NEGATIVE   RBC / HPF 6-30 0 - 5 RBC/hpf   WBC, UA 0-5 0 - 5 WBC/hpf   Bacteria, UA RARE (A) NONE SEEN   Squamous Epithelial / LPF 6-30 (A) NONE SEEN   Mucous PRESENT    Fetal Monitoring: Baseline: 130 bpm Variability: moderate Accelerations:  15 x 15 Decelerations: none Contractions: moderate UI   MAU Course  Procedures None  MDM UA today  IV LR bolus with 4 mg IV Zofran given  No emesis or diarrhea while in MAU. Patient feels improvement in nausea with Zofran and is able to tolerate PO fluids. She states improvement in abdominal cramping and normal fetal movement after IV fluid bolus. She is reassured of normal movement.   Assessment and Plan  A: SIUP at [redacted]w[redacted]d Reactive NST Viral gastroenteritis   P:  Discharge home Rx for Zofran given to patient  Advised to take Tylenol PRN for pain Preterm labor precautions discussed Patient advised to follow-up with [facility] for  routine prenatal care soon Patient may return to MAU as needed or if her condition were to change or worsen   Marny Lowenstein, PA-C  12/06/2016, 2:40 PM

## 2016-12-06 NOTE — MAU Note (Addendum)
Feeling sick, started 3 days ago, vomiting, diarrhea, pain all over, more in her arms and stomach.  Can't sleep at night.  Her son was sick first.  Pecola Leisure is not moving as much

## 2016-12-06 NOTE — Progress Notes (Signed)
Baby active and pt is now aware of FM. Initially baby moving and pt unaware

## 2016-12-06 NOTE — Progress Notes (Signed)
Written and verbal d/c instructions given and understanding voiced. 

## 2016-12-06 NOTE — Progress Notes (Addendum)
Pt sat up at 1344 and 1356 and was tracing Maternal HR. OK to d/c efm per Harlon Flor PA

## 2016-12-17 ENCOUNTER — Inpatient Hospital Stay (HOSPITAL_COMMUNITY)
Admission: AD | Admit: 2016-12-17 | Discharge: 2016-12-18 | Disposition: A | Discharge status: Home / Self Care | Payer: Medicaid Other | Source: Ambulatory Visit | Attending: Obstetrics and Gynecology | Admitting: Obstetrics and Gynecology

## 2016-12-17 DIAGNOSIS — Z3A33 33 weeks gestation of pregnancy: Secondary | ICD-10-CM | POA: Insufficient documentation

## 2016-12-17 DIAGNOSIS — O99019 Anemia complicating pregnancy, unspecified trimester: Secondary | ICD-10-CM

## 2016-12-17 DIAGNOSIS — J029 Acute pharyngitis, unspecified: Secondary | ICD-10-CM | POA: Insufficient documentation

## 2016-12-17 DIAGNOSIS — O99283 Endocrine, nutritional and metabolic diseases complicating pregnancy, third trimester: Secondary | ICD-10-CM | POA: Insufficient documentation

## 2016-12-17 DIAGNOSIS — R059 Cough, unspecified: Secondary | ICD-10-CM

## 2016-12-17 DIAGNOSIS — E860 Dehydration: Secondary | ICD-10-CM | POA: Insufficient documentation

## 2016-12-17 DIAGNOSIS — R6889 Other general symptoms and signs: Secondary | ICD-10-CM

## 2016-12-17 DIAGNOSIS — R509 Fever, unspecified: Secondary | ICD-10-CM | POA: Insufficient documentation

## 2016-12-17 DIAGNOSIS — O9989 Other specified diseases and conditions complicating pregnancy, childbirth and the puerperium: Secondary | ICD-10-CM | POA: Insufficient documentation

## 2016-12-17 DIAGNOSIS — O99013 Anemia complicating pregnancy, third trimester: Secondary | ICD-10-CM | POA: Insufficient documentation

## 2016-12-17 DIAGNOSIS — D509 Iron deficiency anemia, unspecified: Secondary | ICD-10-CM | POA: Insufficient documentation

## 2016-12-17 DIAGNOSIS — O26893 Other specified pregnancy related conditions, third trimester: Secondary | ICD-10-CM

## 2016-12-17 DIAGNOSIS — R05 Cough: Secondary | ICD-10-CM | POA: Insufficient documentation

## 2016-12-17 DIAGNOSIS — M79606 Pain in leg, unspecified: Secondary | ICD-10-CM

## 2016-12-18 ENCOUNTER — Encounter (HOSPITAL_COMMUNITY): Payer: Self-pay | Admitting: *Deleted

## 2016-12-18 ENCOUNTER — Inpatient Hospital Stay (HOSPITAL_COMMUNITY): Payer: Medicaid Other

## 2016-12-18 DIAGNOSIS — O99283 Endocrine, nutritional and metabolic diseases complicating pregnancy, third trimester: Secondary | ICD-10-CM | POA: Diagnosis not present

## 2016-12-18 DIAGNOSIS — O99019 Anemia complicating pregnancy, unspecified trimester: Secondary | ICD-10-CM

## 2016-12-18 DIAGNOSIS — Z3A33 33 weeks gestation of pregnancy: Secondary | ICD-10-CM | POA: Diagnosis not present

## 2016-12-18 DIAGNOSIS — R509 Fever, unspecified: Secondary | ICD-10-CM | POA: Diagnosis present

## 2016-12-18 DIAGNOSIS — R05 Cough: Secondary | ICD-10-CM | POA: Diagnosis present

## 2016-12-18 DIAGNOSIS — E860 Dehydration: Secondary | ICD-10-CM | POA: Diagnosis not present

## 2016-12-18 DIAGNOSIS — D509 Iron deficiency anemia, unspecified: Secondary | ICD-10-CM | POA: Diagnosis not present

## 2016-12-18 DIAGNOSIS — J029 Acute pharyngitis, unspecified: Secondary | ICD-10-CM | POA: Diagnosis not present

## 2016-12-18 DIAGNOSIS — O9989 Other specified diseases and conditions complicating pregnancy, childbirth and the puerperium: Secondary | ICD-10-CM | POA: Diagnosis not present

## 2016-12-18 DIAGNOSIS — O99013 Anemia complicating pregnancy, third trimester: Secondary | ICD-10-CM | POA: Diagnosis not present

## 2016-12-18 LAB — CBC WITH DIFFERENTIAL/PLATELET
BASOS ABS: 0 10*3/uL (ref 0.0–0.1)
BASOS PCT: 0 %
Eosinophils Absolute: 0.2 10*3/uL (ref 0.0–0.7)
Eosinophils Relative: 2 %
HEMATOCRIT: 27.9 % — AB (ref 36.0–46.0)
Hemoglobin: 9.1 g/dL — ABNORMAL LOW (ref 12.0–15.0)
LYMPHS PCT: 27 %
Lymphs Abs: 2.7 10*3/uL (ref 0.7–4.0)
MCH: 26.8 pg (ref 26.0–34.0)
MCHC: 32.6 g/dL (ref 30.0–36.0)
MCV: 82.1 fL (ref 78.0–100.0)
MONO ABS: 0.6 10*3/uL (ref 0.1–1.0)
Monocytes Relative: 6 %
NEUTROS ABS: 6.7 10*3/uL (ref 1.7–7.7)
Neutrophils Relative %: 65 %
PLATELETS: 305 10*3/uL (ref 150–400)
RBC: 3.4 MIL/uL — AB (ref 3.87–5.11)
RDW: 13.7 % (ref 11.5–15.5)
WBC: 10.2 10*3/uL (ref 4.0–10.5)

## 2016-12-18 LAB — COMPREHENSIVE METABOLIC PANEL
ALBUMIN: 2.7 g/dL — AB (ref 3.5–5.0)
ALT: 10 U/L — AB (ref 14–54)
ANION GAP: 8 (ref 5–15)
AST: 14 U/L — AB (ref 15–41)
Alkaline Phosphatase: 136 U/L — ABNORMAL HIGH (ref 38–126)
BILIRUBIN TOTAL: 0.4 mg/dL (ref 0.3–1.2)
BUN: 9 mg/dL (ref 6–20)
CHLORIDE: 104 mmol/L (ref 101–111)
CO2: 23 mmol/L (ref 22–32)
Calcium: 8.7 mg/dL — ABNORMAL LOW (ref 8.9–10.3)
Creatinine, Ser: 0.37 mg/dL — ABNORMAL LOW (ref 0.44–1.00)
GFR calc Af Amer: >60 mL/min (ref >60)
Glucose, Bld: 94 mg/dL (ref 65–99)
POTASSIUM: 3.7 mmol/L (ref 3.5–5.1)
Sodium: 135 mmol/L (ref 135–145)
Total Protein: 6 g/dL — ABNORMAL LOW (ref 6.5–8.1)

## 2016-12-18 LAB — INFLUENZA PANEL BY PCR (TYPE A & B)
INFLAPCR: NEGATIVE
INFLBPCR: NEGATIVE

## 2016-12-18 MED ORDER — CETIRIZINE HCL 10 MG PO TABS: 10.0000 mg | ORAL_TABLET | Freq: Every day | ORAL | 2 refills | Status: DC

## 2016-12-18 MED ORDER — LACTATED RINGERS IV BOLUS (SEPSIS)
1000.0000 mL | Freq: Once | INTRAVENOUS | Status: AC
  Administered 2016-12-18: 1000 mL via INTRAVENOUS

## 2016-12-18 MED ORDER — GUAIFENESIN-CODEINE 100-10 MG/5ML PO SOLN: 5.0000 mL | Freq: Every evening | ORAL | 0 refills | Status: DC | PRN

## 2016-12-18 MED ORDER — GUAIFENESIN-CODEINE 100-10 MG/5ML PO SOLN
5.0000 mL | Freq: Once | ORAL | Status: AC
  Administered 2016-12-18: 5 mL via ORAL
  Filled 2016-12-18: qty 5

## 2016-12-18 MED ORDER — OSELTAMIVIR PHOSPHATE 75 MG PO CAPS: 75.0000 mg | ORAL_CAPSULE | Freq: Two times a day (BID) | ORAL | 0 refills | Status: DC

## 2016-12-18 MED ORDER — FERROUS SULFATE 325 (65 FE) MG PO TABS: 325.0000 mg | ORAL_TABLET | Freq: Every day | ORAL | 1 refills | Status: DC

## 2016-12-18 NOTE — MAU Note (Signed)
Sore throat for 5 days. Fever 102 to 103 Sat. Legs hurt all the time. Denies LOF or bleeding. Fatigue and hard to breathe. Cough but not productive

## 2016-12-18 NOTE — MAU Note (Signed)
Urine sent to lab 

## 2016-12-18 NOTE — MAU Provider Note (Signed)
History     CSN: [id number]  Arrival date and time: 12/17/16 2350   First Provider Initiated Contact with Patient 12/18/16 0103      Chief Complaint  Patient presents with  . Cough  . Fever  . Sore Throat  . Leg Pain   HPI    Ms.Brooke Howard is a 26 y.o. female G2P1001 @ 13w5dhere in MAU with sore throat, cough and fever. Symptoms started 5 days ago. Fever got as high as 102. Her son was sick last week with similar symptoms; he was told he had pink eye.   She has tried tylenol and sudafed for the symptoms. This did not help her symptoms. She has had off and on bilateral leg cramping for the last month, worse in the last 5 days. No swelling. + fetal movement, denies vaginal bleeding.   OB History    Gravida Para Term Preterm AB Living   _0 0 0 1   SAB TAB Ectopic Multiple Live Births   0 0 0 0 1      Past Medical History:  Diagnosis Date  . Acute blood loss anemia 01/07/2015  . Medical history non-contributory     Past Surgical History:  Procedure Laterality Date  . MOLE REMOVAL    . NO PAST SURGERIES      Family History  Problem Relation Age of Onset  . Hypertension Maternal Grandmother     Social History  Substance Use Topics  . Smoking status: Never Smoker  . Smokeless tobacco: Never Used  . Alcohol use No    Allergies: No Known Allergies  Prescriptions Prior to Admission  Medication Sig Dispense Refill Last Dose  . acetaminophen (TYLENOL) 500 MG tablet Take 1,000 mg by mouth every 6 (six) hours as needed.   12/18/2016 at Unknown time  . ondansetron (ZOFRAN) 4 MG tablet Take 1 tablet (4 mg total) by mouth every 6 (six) hours. 12 tablet 0 Past Month at Unknown time  . Prenatal Vit-Fe Fumarate-FA (PRENATAL MULTIVITAMIN) TABS tablet Take 1 tablet by mouth daily at 12 noon.   Past Week at Unknown time  . pseudoephedrine (SUDAFED) 30 MG tablet Take 30 mg by mouth every 4 (four) hours as needed for congestion.   12/18/2016 at Unknown time   Results for  orders placed or performed during the hospital encounter of 12/17/16 (from the past 48 hour(s))  Influenza panel by PCR (type A & B)     Status: None   Collection Time: 12/18/16 12:42 AM  Result Value Ref Range   Influenza A By PCR NEGATIVE NEGATIVE   Influenza B By PCR NEGATIVE NEGATIVE    Comment: (NOTE) The Xpert Xpress Flu assay is intended as an aid in the diagnosis of  influenza and should not be used as a sole basis for treatment.  This  assay is FDA approved for nasopharyngeal swab specimens only. Nasal  washings and aspirates are unacceptable for Xpert Xpress Flu testing.   CBC with Differential     Status: Abnormal   Collection Time: 12/18/16  1:09 AM  Result Value Ref Range   WBC 10.2 4.0 - 10.5 K/uL   RBC 3.40 (L) 3.87 - 5.11 MIL/uL   Hemoglobin 9.1 (L) 12.0 - 15.0 g/dL   HCT 27.9 (L) 36.0 - 46.0 %   MCV 82.1 78.0 - 100.0 fL   MCH 26.8 26.0 - 34.0 pg   MCHC 32.6 30.0 - 36.0 g/dL   RDW 13.7 11.5 -  15.5 %   Platelets 305 150 - 400 K/uL   Neutrophils Relative % 65 %   Neutro Abs 6.7 1.7 - 7.7 K/uL   Lymphocytes Relative 27 %   Lymphs Abs 2.7 0.7 - 4.0 K/uL   Monocytes Relative 6 %   Monocytes Absolute 0.6 0.1 - 1.0 K/uL   Eosinophils Relative 2 %   Eosinophils Absolute 0.2 0.0 - 0.7 K/uL   Basophils Relative 0 %   Basophils Absolute 0.0 0.0 - 0.1 K/uL  Comprehensive metabolic panel     Status: Abnormal   Collection Time: 12/18/16  1:09 AM  Result Value Ref Range   Sodium 135 135 - 145 mmol/L   Potassium 3.7 3.5 - 5.1 mmol/L   Chloride 104 101 - 111 mmol/L   CO2 23 22 - 32 mmol/L   Glucose, Bld 94 65 - 99 mg/dL   BUN 9 6 - 20 mg/dL   Creatinine, Ser 0.37 (L) 0.44 - 1.00 mg/dL   Calcium 8.7 (L) 8.9 - 10.3 mg/dL   Total Protein 6.0 (L) 6.5 - 8.1 g/dL   Albumin 2.7 (L) 3.5 - 5.0 g/dL   AST 14 (L) 15 - 41 U/L   ALT 10 (L) 14 - 54 U/L   Alkaline Phosphatase 136 (H) 38 - 126 U/L   Total Bilirubin 0.4 0.3 - 1.2 mg/dL   GFR calc non Af Amer >60 >60 mL/min   GFR  calc Af Amer >60 >60 mL/min    Comment: (NOTE) The eGFR has been calculated using the CKD EPI equation. This calculation has not been validated in all clinical situations. eGFR's persistently <60 mL/min signify possible Chronic Kidney Disease.    Anion gap 8 5 - 15     Review of Systems  Constitutional: Positive for fever.  HENT: Positive for congestion, sore throat and voice change.    Physical Exam   Blood pressure 106/64, pulse (!) 105, temperature 98.4 F (36.9 C), resp. rate 18, height _0  (1.626 m), weight 172 lb (78 kg), last menstrual period 04/14/2016, SpO2 100 %, unknown if currently breastfeeding.  Physical Exam  Constitutional: She is oriented to person, place, and time. She appears well-developed and well-nourished.  Non-toxic appearance. She has a sickly appearance. She appears ill. No distress.  HENT:  Mouth/Throat: Mucous membranes are normal. No oropharyngeal exudate, posterior oropharyngeal edema, posterior oropharyngeal erythema or tonsillar abscesses.  Cardiovascular: Normal rate.   Respiratory: Effort normal and breath sounds normal. No respiratory distress. She has no wheezes. She has no rales. She exhibits no tenderness.  Musculoskeletal: Normal range of motion.  Neurological: She is alert and oriented to person, place, and time.  Skin: Skin is warm.  Psychiatric: Her behavior is normal.    Fetal Tracing: Baseline: 120 bpm Variability: Moderate  Accelerations: 15x15 Decelerations: None Toco: None   MAU Course  Procedures  None  MDM  LR bolus Chest xray normal  CBC with diff Influenza swab.  Codeine cough syrup.   Assessment and Plan   A:  1. Iron deficiency anemia of pregnancy   2. Flu-like symptoms   3. Cough in adult   4. Mild dehydration   5. Pregnancy related leg pain in third trimester, antepartum     P:  Discharge home in stable condition Increase PO fluid intake Rx: Tamiflu, zyrtec, iron, codeine cough syrup Return to  MAU if symptoms worsen Follow up with OB   Lezlie Lye, NP 12/19/2016 12:49 PM

## 2016-12-18 NOTE — Discharge Instructions (Signed)
Allergies, Adult An allergy is when your body's defense system (immune system) overreacts to an otherwise harmless substance (allergen) that you breathe in or eat or something that touches your skin. When you come into contact with something that you are allergic to, your immune system produces certain proteins (antibodies). These proteins cause cells to release chemicals (histamines) that trigger the symptoms of an allergic reaction. Allergies often affect the nasal passages (allergic rhinitis), eyes (allergic conjunctivitis), skin (atopic dermatitis), and stomach. Allergies can be mild or severe. Allergies cannot spread from person to person (are not contagious). They can develop at any age and may be outgrown. What are the causes? Allergies can be caused by any substance that your immune system mistakenly targets as harmful. These may include:  Outdoor allergens, such as pollen, grass, weeds, car exhaust, and mold spores.  Indoor allergens, such as dust, smoke, mold, and pet dander.  Foods, especially peanuts, milk, eggs, fish, shellfish, soy, nuts, and wheat.  Medicines, such as penicillin.  Skin irritants, such as detergents, chemicals, and latex.  Perfume.  Insect bites or stings. What increases the risk? You may be at greater risk of allergies if other people in your family have allergies. What are the signs or symptoms? Symptoms depend on what type of allergy you have. They may include:  Runny, stuffy nose.  Sneezing.  Itchy mouth, ears, or throat.  Postnasal drip.  Sore throat.  Itchy, red, watery, or puffy eyes.  Skin rash or hives.  Stomach pain.  Vomiting.  Diarrhea.  Bloating.  Wheezing or coughing. People with a severe allergy to food, medicine, or an insect bite may have a life-threatening allergic reaction (anaphylaxis). Symptoms of anaphylaxis include:  Hives.  Itching.  Flushed face.  Swollen lips, tongue, or mouth.  Tight or swollen  throat.  Chest pain or tightness in the chest.  Trouble breathing or shortness of breath.  Rapid heartbeat.  Dizziness or fainting.  Vomiting.  Diarrhea.  Pain in the abdomen. How is this diagnosed? This condition is diagnosed based on:  Your symptoms.  Your family and medical history.  A physical exam. You may need to see a health care provider who specializes in treating allergies (allergist). You may also have tests, including:  Skin tests to see which allergens are causing your symptoms, such as:  Skin prick test. In this test, your skin is pricked with a tiny needle and exposed to small amounts of possible allergens to see if your skin reacts.  Intradermal skin test. In this test, a small amount of allergen is injected under your skin to see if your skin reacts.  Patch test. In this test, a small amount of allergen is placed on your skin and then your skin is covered with a bandage. Your health care provider will check your skin after a couple of days to see if a rash has developed.  Blood tests.  Challenges tests. In this test, you inhale a small amount of allergen by mouth to see if you have an allergic reaction. You may also be asked to:  Keep a food diary. A food diary is a record of all the foods and drinks you have in a day and any symptoms you experience.  Practice an elimination diet. An elimination diet involves eliminating specific foods from your diet and then adding them back in one by one to find out if a certain food causes an allergic reaction. How is this treated? Treatment for allergies depends on your symptoms.  Treatment may include:  Cold compresses to soothe itching and swelling.  Eye drops.  Nasal sprays.  Using a saline spray or container (neti pot) to flush out the nose (nasal irrigation). These methods can help clear away mucus and keep the nasal passages moist.  Using a humidifier.  Oral antihistamines or other medicines to block  allergic reaction and inflammation.  Skin creams to treat rashes or itching.  Diet changes to eliminate food allergy triggers.  Repeated exposure to tiny amounts of allergens to build up a tolerance and prevent future allergic reactions (immunotherapy). These include:  Allergy shots.  Oral treatment. This involves taking small doses of an allergen under the tongue (sublingual immunotherapy).  Emergency epinephrine injection (auto-injector) in case of an allergic emergency. This is a self-injectable, pre-measured medicine that must be given within the first few minutes of a serious allergic reaction. Follow these instructions at home:  Avoid known allergens whenever possible.  If you suffer from airborne allergens, wash out your nose daily. You can do this with a saline spray or a neti pot to flush out your nose (nasal irrigation).  Take over-the-counter and prescription medicines only as told by your health care provider.  Keep all follow-up visits as told by your health care provider. This is important.  If you are at risk of a severe allergic reaction (anaphylaxis), keep your auto-injector with you at all times.  If you have ever had anaphylaxis, wear a medical alert bracelet or necklace that states you have a severe allergy. Contact a health care provider if:  Your symptoms do not improve with treatment. Get help right away if:  You have symptoms of anaphylaxis, such as:  Swollen mouth, tongue, or throat.  Pain or tightness in your chest.  Trouble breathing or shortness of breath.  Dizziness or fainting.  Severe abdominal pain, vomiting, or diarrhea. This information is not intended to replace advice given to you by your health care provider. Make sure you discuss any questions you have with your health care provider. Document Released: 11/01/2002 Document Revised: 04/07/2016 Document Reviewed: 02/24/2016 Elsevier Interactive Patient Education  2017 [employer]  Anemia, Nonspecific Anemia is a condition in which the concentration of red blood cells or hemoglobin in the blood is below normal. Hemoglobin is a substance in red blood cells that carries oxygen to the tissues of the body. Anemia results in not enough oxygen reaching these tissues. What are the causes? Common causes of anemia include:  Excessive bleeding. Bleeding may be internal or external. This includes excessive bleeding from periods (in women) or from the intestine.  Poor nutrition.  Chronic kidney, thyroid, and liver disease.  Bone marrow disorders that decrease red blood cell production.  Cancer and treatments for cancer.  HIV, AIDS, and their treatments.  Spleen problems that increase red blood cell destruction.  Blood disorders.  Excess destruction of red blood cells due to infection, medicines, and autoimmune disorders. What are the signs or symptoms?  Minor weakness.  Dizziness.  Headache.  Palpitations.  Shortness of breath, especially with exercise.  Paleness.  Cold sensitivity.  Indigestion.  Nausea.  Difficulty sleeping.  Difficulty concentrating. Symptoms may occur suddenly or they may develop slowly. How is this diagnosed? Additional blood tests are often needed. These help your health care provider determine the best treatment. Your health care provider will check your stool for blood and look for other causes of blood loss. How is this treated? Treatment varies depending on the cause of the  anemia. Treatment can include:  Supplements of iron, vitamin B12, or folic acid.  Hormone medicines.  A blood transfusion. This may be needed if blood loss is severe.  Hospitalization. This may be needed if there is significant continual blood loss.  Dietary changes.  Spleen removal. Follow these instructions at home: Keep all follow-up appointments. It often takes many weeks to correct anemia, and having your health care provider check on  your condition and your response to treatment is very important. Get help right away if:  You develop extreme weakness, shortness of breath, or chest pain.  You become dizzy or have trouble concentrating.  You develop heavy vaginal bleeding.  You develop a rash.  You have bloody or black, tarry stools.  You faint.  You vomit up blood.  You vomit repeatedly.  You have abdominal pain.  You have a fever or persistent symptoms for more than 2-3 days.  You have a fever and your symptoms suddenly get worse.  You are dehydrated. This information is not intended to replace advice given to you by your health care provider. Make sure you discuss any questions you have with your health care provider. Document Released: 09/15/2004 Document Revised: 01/20/2016 Document Reviewed: 02/01/2013 Elsevier Interactive Patient Education  2017 [employer].

## 2016-12-21 ENCOUNTER — Encounter (HOSPITAL_BASED_OUTPATIENT_CLINIC_OR_DEPARTMENT_OTHER): Payer: Self-pay

## 2016-12-21 ENCOUNTER — Emergency Department (HOSPITAL_BASED_OUTPATIENT_CLINIC_OR_DEPARTMENT_OTHER)
Admission: EM | Admit: 2016-12-21 | Discharge: 2016-12-21 | Disposition: A | Discharge status: Home / Self Care | Payer: Medicaid Other | Attending: Emergency Medicine | Admitting: Emergency Medicine

## 2016-12-21 DIAGNOSIS — H66002 Acute suppurative otitis media without spontaneous rupture of ear drum, left ear: Secondary | ICD-10-CM | POA: Insufficient documentation

## 2016-12-21 DIAGNOSIS — O26893 Other specified pregnancy related conditions, third trimester: Secondary | ICD-10-CM | POA: Insufficient documentation

## 2016-12-21 DIAGNOSIS — Z3A32 32 weeks gestation of pregnancy: Secondary | ICD-10-CM | POA: Insufficient documentation

## 2016-12-21 DIAGNOSIS — Z79899 Other long term (current) drug therapy: Secondary | ICD-10-CM | POA: Insufficient documentation

## 2016-12-21 LAB — OB RESULTS CONSOLE HGB/HCT, BLOOD
HCT: 30 %
HEMOGLOBIN: 9.9 g/dL

## 2016-12-21 LAB — OB RESULTS CONSOLE RPR: RPR: NONREACTIVE

## 2016-12-21 MED ORDER — AMOXICILLIN 500 MG PO CAPS
500.0000 mg | ORAL_CAPSULE | ORAL | Status: AC
  Administered 2016-12-21: 500 mg via ORAL
  Filled 2016-12-21: qty 1

## 2016-12-21 MED ORDER — ACETAMINOPHEN 325 MG PO TABS
650.0000 mg | ORAL_TABLET | Freq: Once | ORAL | Status: AC
  Administered 2016-12-21: 650 mg via ORAL
  Filled 2016-12-21: qty 2

## 2016-12-21 MED ORDER — AMOXICILLIN 500 MG PO CAPS: 500.0000 mg | ORAL_CAPSULE | Freq: Three times a day (TID) | ORAL | 0 refills | Status: DC

## 2016-12-21 NOTE — ED Provider Notes (Signed)
[facility]   CSN: [id number] Arrival date & time: 12/21/16  0342     History   Chief Complaint Chief Complaint  Patient presents with  . Otalgia    HPI Brooke Howard is a 26 y.o. female.  Patient is a 26 year old female pregnant at 8 months gestation. She presents for evaluation of left ear pain. She reports a several day history of URI type symptoms. She denies any abdominal pain, contractions, vaginal bleeding or leakage.   The history is provided by the patient.  Otalgia  This is a new problem. The current episode started yesterday. There is pain in the left ear. The problem occurs constantly. The problem has been rapidly worsening. There has been no fever. The pain is moderate.    Past Medical History:  Diagnosis Date  . Acute blood loss anemia 01/07/2015  . Medical history non-contributory     Patient Active Problem List   Diagnosis Date Noted  . Iron deficiency anemia of pregnancy 01/08/2015  . Acute blood loss anemia 01/07/2015  . Active labor at term 01/06/2015  . Postpartum care following vaginal delivery (5/17) 01/06/2015  . Vaginal delivery 01/06/2015    Past Surgical History:  Procedure Laterality Date  . MOLE REMOVAL    . NO PAST SURGERIES      OB History    Gravida Para Term Preterm AB Living   0 0 1   SAB TAB Ectopic Multiple Live Births   0 0 0 0 1       Home Medications    Prior to Admission medications   Medication Sig Start Date End Date Taking? Authorizing Provider  acetaminophen (TYLENOL) 500 MG tablet Take 1,000 mg by mouth every 6 (six) hours as needed.   Yes Historical Provider, MD  Prenatal Vit-Fe Fumarate-FA (PRENATAL MULTIVITAMIN) TABS tablet Take 1 tablet by mouth daily at 12 noon.   Yes Historical Provider, MD  cetirizine (ZYRTEC) 10 MG tablet Take 1 tablet (10 mg total) by mouth daily. 12/18/16   Duane Lope, NP  ferrous sulfate 325 (65 FE) MG tablet Take 1 tablet (325 mg total) by mouth  daily. 12/18/16   Duane Lope, NP  guaiFENesin-codeine 100-10 MG/5ML syrup Take 5 mLs by mouth at bedtime as needed for cough. 12/18/16   Harolyn Rutherford Rasch, NP  ondansetron (ZOFRAN) 4 MG tablet Take 1 tablet (4 mg total) by mouth every 6 (six) hours. 12/06/16   Marny Lowenstein, PA-C  oseltamivir (TAMIFLU) 75 MG capsule Take 1 capsule (75 mg total) by mouth every 12 (twelve) hours. 12/18/16   Duane Lope, NP  pseudoephedrine (SUDAFED) 30 MG tablet Take 30 mg by mouth every 4 (four) hours as needed for congestion.    Historical Provider, MD    Family History Family History  Problem Relation Age of Onset  . Hypertension Maternal Grandmother     Social History Social History  Substance Use Topics  . Smoking status: Never Smoker  . Smokeless tobacco: Never Used  . Alcohol use No     Allergies   Patient has no known allergies.   Review of Systems Review of Systems  HENT: Positive for ear pain.   All other systems reviewed and are negative.    Physical Exam Updated Vital Signs BP 115/75 (BP Location: Right Arm)   Pulse 90   Temp 98.1 F (36.7 C) (Oral)   Resp 18   Ht  (1.676 m)  Wt 165 lb (74.8 kg)   LMP 04/14/2016 (Exact Date)   SpO2 98%   BMI 26.63 kg/m   Physical Exam  Constitutional: She is oriented to person, place, and time. She appears well-developed and well-nourished. No distress.  HENT:  Head: Normocephalic and atraumatic.  Mouth/Throat: Oropharynx is clear and moist.  The left tympanic membrane is erythematous and bulging.  Neck: Normal range of motion. Neck supple.  Cardiovascular: Normal rate and regular rhythm.  Exam reveals no gallop and no friction rub.   No murmur heard. Pulmonary/Chest: Effort normal and breath sounds normal. No respiratory distress. She has no wheezes.  Abdominal: Soft. Bowel sounds are normal. She exhibits no distension. There is no tenderness.  Musculoskeletal: Normal range of motion.  Neurological: She is alert and  oriented to person, place, and time.  Skin: Skin is warm and dry. She is not diaphoretic.  Nursing note and vitals reviewed.    ED Treatments / Results  Labs (all labs ordered are listed, but only abnormal results are displayed) Labs Reviewed - No data to display  EKG  EKG Interpretation None       Radiology No results found.  Procedures Procedures (including critical care time)  Medications Ordered in ED Medications - No data to display   Initial Impression / Assessment and Plan / ED Course  I have reviewed the triage vital signs and the nursing notes.  Pertinent labs & imaging results that were available during my care of the patient were reviewed by me and considered in my medical decision making (see chart for details).  OM. Will treat with amoxicillin, tylenol, motrin, prn follow up.  Final Clinical Impressions(s) / ED Diagnoses   Final diagnoses:  None    New Prescriptions New Prescriptions   No medications on file     Geoffery Lyons, MD 12/21/16 [phone]

## 2016-12-21 NOTE — ED Triage Notes (Addendum)
PT presents with one day history of left sided otalgia, sore throat, headache, and nasal congestion. Denies fever. Pt is 8 months pregnant, EDD: 01/31/17, states followed by [facility], taking prenatal vitamins, with plans for vaginal delivery, pt reports fetal movement. Denies vaginal discharge or fluid leakage. Recent MAU visit for similar symptoms, but otalgia is new. G2, P1.

## 2016-12-21 NOTE — Discharge Instructions (Signed)
Amoxicillin as prescribed.  Tylenol 1000 mg every 6 hours as needed for pain.  Follow-up with primary Dr. if not improving in the next 3-4 days.

## 2016-12-29 ENCOUNTER — Encounter: Payer: Self-pay | Admitting: *Deleted

## 2016-12-29 LAB — OB RESULTS CONSOLE GC/CHLAMYDIA
Chlamydia: NEGATIVE
Gonorrhea: NEGATIVE

## 2016-12-29 LAB — OB RESULTS CONSOLE GBS: GBS: NEGATIVE

## 2017-01-02 ENCOUNTER — Ambulatory Visit: Payer: Medicaid Other | Admitting: *Deleted

## 2017-01-02 ENCOUNTER — Encounter: Payer: Medicaid Other | Attending: Family Medicine | Admitting: *Deleted

## 2017-01-02 DIAGNOSIS — O0990 Supervision of high risk pregnancy, unspecified, unspecified trimester: Secondary | ICD-10-CM | POA: Insufficient documentation

## 2017-01-02 DIAGNOSIS — O24419 Gestational diabetes mellitus in pregnancy, unspecified control: Secondary | ICD-10-CM | POA: Insufficient documentation

## 2017-01-02 DIAGNOSIS — Z3A00 Weeks of gestation of pregnancy not specified: Secondary | ICD-10-CM | POA: Diagnosis not present

## 2017-01-02 DIAGNOSIS — O24410 Gestational diabetes mellitus in pregnancy, diet controlled: Secondary | ICD-10-CM

## 2017-01-02 MED ORDER — ACCU-CHEK GUIDE W/DEVICE KIT: 1.0000 | PACK | Freq: Once | 0 refills | Status: AC

## 2017-01-02 MED ORDER — ACCU-CHEK FASTCLIX LANCETS MISC
1.0000 | Freq: Four times a day (QID) | 12 refills | Status: DC
Start: 2017-01-02 — End: 2017-02-01

## 2017-01-02 MED ORDER — GLUCOSE BLOOD VI STRP: ORAL_STRIP | 12 refills | Status: DC

## 2017-01-02 NOTE — Progress Notes (Signed)
  Patient was seen on 01/02/2017 for Gestational Diabetes self-management . Patient states no history of GDM. She states [facility] The following learning objectives were met by the patient :   States the definition of Gestational Diabetes  States why dietary management is important in controlling blood glucose  Describes the effects of carbohydrates on blood glucose levels  Demonstrates ability to create a balanced meal plan  Demonstrates carbohydrate counting   States when to check blood glucose levels  Demonstrates proper blood glucose monitoring techniques  States the effect of stress and exercise on blood glucose levels  States the importance of limiting caffeine and abstaining from alcohol and smoking  Plan:  Aim for 3 Carb Choices per meal (45 grams) +/- 1 either way  Aim for 1-2 Carbs per snack Begin reading food labels for Total Carbohydrate of foods Consider  increasing your activity level by walking or other activity daily as tolerated Begin checking BG before breakfast and 2 hours after first bite of breakfast, lunch and dinner as directed by MD  Bring Log Book to every medical appointment   Take medication if directed by MD  Blood glucose monitor Rx called into pharmacy. Accu Check Guide with Fast Clix Drum Patient instructed to test pre breakfast and 2 hours each meal as directed by MD Patient informed of Baby Scripts and agreed to join.   Patient instructed to monitor glucose levels: FBS: 60 - <90 2 hour: <120  Patient received the following handouts:  Nutrition Diabetes and Pregnancy  Carbohydrate Counting List  Patient will be seen for follow-up as needed.

## 2017-01-02 NOTE — Addendum Note (Signed)
Addended by: Kathee DeltonHILLMAN, [place] Ravenscroft L on: 01/02/2017 04:07 PM   Modules accepted: Orders

## 2017-01-05 ENCOUNTER — Other Ambulatory Visit: Payer: Self-pay | Admitting: *Deleted

## 2017-01-05 ENCOUNTER — Encounter: Payer: Self-pay | Admitting: *Deleted

## 2017-01-09 ENCOUNTER — Ambulatory Visit (INDEPENDENT_AMBULATORY_CARE_PROVIDER_SITE_OTHER): Payer: Medicaid Other | Admitting: Family Medicine

## 2017-01-09 ENCOUNTER — Encounter: Payer: Self-pay | Admitting: Family Medicine

## 2017-01-09 VITALS — BP 112/61 | HR 102 | Wt 175.0 lb

## 2017-01-09 DIAGNOSIS — O0990 Supervision of high risk pregnancy, unspecified, unspecified trimester: Secondary | ICD-10-CM | POA: Insufficient documentation

## 2017-01-09 DIAGNOSIS — O24419 Gestational diabetes mellitus in pregnancy, unspecified control: Secondary | ICD-10-CM | POA: Insufficient documentation

## 2017-01-09 DIAGNOSIS — O0993 Supervision of high risk pregnancy, unspecified, third trimester: Secondary | ICD-10-CM

## 2017-01-09 NOTE — Patient Instructions (Signed)
Gestational Diabetes Mellitus, Self Care When you have gestational diabetes (gestational diabetes mellitus), you must keep your blood sugar (glucose) under control. You can do this with:  Nutrition.  Exercise.  Lifestyle changes.  Medicines or insulin, if needed.  Support from your doctors and others. How do I manage my blood sugar?  Check your blood sugar every day during pregnancy. Check it as often as told.  Call your doctor if your blood sugar is above your goal numbers for 2 tests in a row. Your doctor will set treatment goals for you. Try to have these blood sugars:  After not eating for a long time (after fasting): at or below 95 mg/dL (5.3 mmol/L).  After meals (postprandial):  One hour after a meal: at or below 140 mg/dL (7.8 mmol/L).  Two hours after a meal: at or below 120 mg/dL (6.7 mmol/L).  A1c (hemoglobin A1c) level: 6-6.5%. What do I need to know about high blood sugar? High blood sugar is called hyperglycemia. Know the early signs of high blood sugar. Signs include:  Feeling:  Thirsty.  Hungry.  Very tired.  Needing to pee (urinate) more than usual.  Blurry vision. What do I need to know about low blood sugar? Low blood sugar is called hypoglycemia. This is when blood sugar is at or below 70 mg/dL (3.9 mmol/L). Symptoms may include:  Feeling:  Hungry.  Worried or nervous (anxious).  Sweaty or clammy.  Confused.  Dizzy.  Sleepy.  Sick to your stomach (nauseous).  Having:  A fast heartbeat.  A headache.  A change in vision.  Jerky movements that you cannot control (seizure).  Nightmares.  Tingling or no feeling (numbness) around the mouth, lips, or tongue.  Having trouble with:  Talking.  Paying attention (concentrating).  Moving (coordination).  Sleeping.  Shaking.  Passing out (fainting).  Getting upset easily (irritability). Treating low blood sugar   To treat low blood sugar, eat or drink something sugary  right away. If you can think clearly and swallow safely, follow the 15:15 rule:  Take 15 grams of a fast-acting carb (carbohydrate). Some fast-acting carbs are:  1 tube of glucose gel.  3 sugar tablets (glucose pills).  6-8 pieces of hard candy.  4 oz (120 mL) of fruit juice.  4 oz (120 mL) regular (not diet) soda.  Check your blood sugar 15 minutes after you take the carb.  If your blood sugar is still at or below 70 mg/dL (3.9 mmol/L), take 15 grams of a carb again.  If your blood sugar does not go above 70 mg/dL (3.9 mmol/L) after 3 tries, get help right away.  After your blood sugar goes back to normal, eat a meal or a snack within 1 hour. Treating very low blood sugar  If your blood sugar is at or below 54 mg/dL (3 mmol/L), you have very low blood sugar (severe hypoglycemia). This is an emergency. Do not wait to see if the symptoms will go away. Get medical help right away. Call your local emergency services (911 in the [place] Do not drive yourself to the hospital. If you have very low blood sugar and you cannot eat or drink, you may need a glucagon shot (injection). A family member or friend should learn:  How to check your blood sugar.  How to give you a glucagon shot. Ask your doctor if you need a glucagon shot kit at home. What else is important to manage my diabetes? Medicine   Take your insulin  and diabetes medicines as told.  Do not run out of insulin or medicines.  Adjust your insulin and diabetes medicines as told. Food    Make healthy food choices. These include:  Chicken, fish, egg whites, and beans.  Oats, whole wheat, bulgur, brown rice, quinoa, and millet.  Fresh fruits and vegetables.  Low-fat dairy products.  Nuts, avocado, olive oil, and canola oil.  Make an eating plan. A food specialist (dietitian) can help you.  Follow instructions from your doctor about what you cannot eat or drink.  Drink enough fluid to keep your pee (urine) clear or  pale yellow.  Eat healthy snacks between healthy meals.  Keep track of carbs you eat. Read food labels. Learn about food serving sizes.  Follow your sick day plan when you cannot eat or drink normally. Make this plan with your doctor. Activity   Exercise 30 minutes or more a day or as much as told by your doctor.  Talk with your doctor if you start a new exercise. Your doctor may need to adjust your insulin, medicines, or food. Lifestyle   Do not drink alcohol.  Do not use any tobacco products. If you need help quitting, ask your doctor.  Learn how to deal with stress. If you need help with this, ask your doctor. Body care    Stay up to date with your shots (immunizations).  Brush your teeth and gums two times a day. Floss at least one time a day.  Go to the dentist least one time every 6 months.  Stay at a healthy weight while you are pregnant. General instructions   Take over-the-counter and prescription medicines only as told by your doctor.  Ask your doctor about risks of high blood pressure in pregnancy. These are called preeclampsia and eclampsia.  Share your diabetes care plan with:  Your work or school.  People you live with.  Check your pee for ketones:  When you are sick.  As told by your doctor.  Ask your doctor:  Do I need to meet with a diabetes educator?  Where can I find a support group for people with diabetes?  Carry a card or wear jewelry that says that you have diabetes.  Keep all follow-up visits with your doctor. This is important. Care after giving birth    Have your blood sugar checked 4-12 weeks after you give birth.  Get checked for diabetes at least every 3 years. Where to find more information: To learn more about diabetes, visit:  American Diabetes Association: www.diabetes.org/diabetes-basics/gestational  Centers for Disease Control and Prevention (CDC): http://sanchez-watson.com/.pdf This  information is not intended to replace advice given to you by your health care provider. Make sure you discuss any questions you have with your health care provider. Document Released: 11/30/2015 Document Revised: 01/14/2016 Document Reviewed: 09/11/2015 Elsevier Interactive Patient Education  2017 [employer].

## 2017-01-09 NOTE — Progress Notes (Signed)
  Subjective:  Brooke Howard is a G2P1001 3881w6d being seen today for her first obstetrical visit. She is being referred from the [facility], which she had care starting in November. Her obstetrical history is significant for gestational diabetes. Patient does intend to breast feed. Pregnancy history fully reviewed.  Patient reports no complaints.  BP 112/61   Pulse (!) 102   Wt 175 lb (79.4 kg)   LMP 04/14/2016 (Exact Date)   BMI 30.04 kg/m   HISTORY: OB History  Gravida Para Term Preterm AB Living  2 1 1  0 0 1  SAB TAB Ectopic Multiple Live Births  0 0 0 0 1    # Outcome Date GA Lbr Len/2nd Weight Sex Delivery Anes PTL Lv  2 Current           1 Term 01/06/15 5770w0d 15:19 / 02:09 7 lb 11.1 oz (3.49 kg) M Vag-Spont EPI  LIV     Birth Comments: caput      Past Medical History:  Diagnosis Date  . Acute blood loss anemia 01/07/2015  . Infection    kidney infection  . Medical history non-contributory   . postpartum depression     Past Surgical History:  Procedure Laterality Date  . MOLE REMOVAL    . NO PAST SURGERIES      Family History  Problem Relation Age of Onset  . Hypertension Maternal Grandmother   . Thyroid disease Maternal Grandmother   . Hypertension Mother      Exam    System:     Skin: normal coloration and turgor, no rashes    Neurologic: oriented, gait normal; reflexes normal and symmetric   Extremities: normal strength, tone, and muscle mass   HEENT PERRLA and extra ocular movement intact   Mouth/Teeth mucous membranes moist, pharynx normal without lesions   Neck supple and no masses   Cardiovascular: regular rate and rhythm, no murmurs or gallops   Respiratory:  appears well, vitals normal, no respiratory distress, acyanotic, normal RR, ear and throat exam is normal, neck free of mass or lymphadenopathy, chest clear, no wheezing, crepitations, rhonchi, normal symmetric air entry   Abdomen: soft, non-tender; bowel sounds normal; no masses,  no  organomegaly          Assessment:    Pregnancy: G2P1001 Patient Active Problem List   Diagnosis Date Noted  . Supervision of high risk pregnancy, antepartum 01/09/2017  . Gestational diabetes mellitus 01/09/2017      Plan:   1. Supervision of high risk pregnancy, antepartum FHT and FH normal. Neg GC/CT, neg GBS.  2. Gestational diabetes mellitus (GDM) in third trimester, gestational diabetes method of control unspecified Discussed testing, goals, potential risks of GDM including macrosomia, severe laceration, increased cesarean rate, increased shoulder dystocia with potential anoxic brain damage or death, or brachial plexus injury. Pt to get supplies today and will start testing.     Problem list reviewed and updated. 100% of 30 min visit spent on counseling and coordination of care.     Levie HeritageJacob J Stinson 12/02/2016

## 2017-01-14 ENCOUNTER — Encounter: Payer: Self-pay | Admitting: Obstetrics and Gynecology

## 2017-01-25 ENCOUNTER — Ambulatory Visit (INDEPENDENT_AMBULATORY_CARE_PROVIDER_SITE_OTHER): Payer: Medicaid Other | Admitting: Obstetrics and Gynecology

## 2017-01-25 VITALS — BP 117/68 | HR 92 | Wt 179.7 lb

## 2017-01-25 DIAGNOSIS — O0990 Supervision of high risk pregnancy, unspecified, unspecified trimester: Secondary | ICD-10-CM

## 2017-01-25 DIAGNOSIS — Z23 Encounter for immunization: Secondary | ICD-10-CM

## 2017-01-25 DIAGNOSIS — O24419 Gestational diabetes mellitus in pregnancy, unspecified control: Secondary | ICD-10-CM

## 2017-01-25 DIAGNOSIS — O0993 Supervision of high risk pregnancy, unspecified, third trimester: Secondary | ICD-10-CM

## 2017-01-25 LAB — POCT URINALYSIS DIP (DEVICE)
BILIRUBIN URINE: NEGATIVE
Glucose, UA: NEGATIVE mg/dL
Hgb urine dipstick: NEGATIVE
Ketones, ur: NEGATIVE mg/dL
Leukocytes, UA: NEGATIVE
NITRITE: NEGATIVE
PH: 6 (ref 5.0–8.0)
Protein, ur: NEGATIVE mg/dL
Specific Gravity, Urine: >=1.03 (ref 1.005–1.030)
UROBILINOGEN UA: 1 mg/dL (ref 0.0–1.0)

## 2017-01-25 MED ORDER — TETANUS-DIPHTH-ACELL PERTUSSIS 5-2.5-18.5 LF-MCG/0.5 IM SUSP
0.5000 mL | Freq: Once | INTRAMUSCULAR | Status: AC
  Administered 2017-01-25: 0.5 mL via INTRAMUSCULAR

## 2017-01-25 NOTE — Progress Notes (Signed)
C/o having cramping today.  C/o baby moving less x 2 days. States before baby moved all the time, now only 3 or 4 times a day.   IOL scheduled for 01/31/17.

## 2017-01-25 NOTE — Progress Notes (Signed)
   PRENATAL VISIT NOTE  Subjective:  Brooke Howard is a 26 y.o. G2P1001 at 2089w1d being seen today for ongoing prenatal care.  She is currently monitored for the following issues for this high-risk pregnancy and has Supervision of high risk pregnancy, antepartum and Gestational diabetes mellitus on her problem list.  Patient reports no complaints.  Contractions: Not present. Vag. Bleeding: None.  Movement: (!) Decreased. Denies leaking of fluid.   The following portions of the patient's history were reviewed and updated as appropriate: allergies, current medications, past family history, past medical history, past social history, past surgical history and problem list. Problem list updated.  Objective:   Vitals:   01/25/17 1410  BP: 117/68  Pulse: 92  Weight: 179 lb 11.2 oz (81.5 kg)    Fetal Status: Fetal Heart Rate (bpm): NST   Movement: (!) Decreased     General:  Alert, oriented and cooperative. Patient is in no acute distress.  Skin: Skin is warm and dry. No rash noted.   Cardiovascular: Normal heart rate noted  Respiratory: Normal respiratory effort, no problems with respiration noted  Abdomen: Soft, gravid, appropriate for gestational age. Pain/Pressure: Present     Pelvic:  Cervical exam deferred        Extremities: Normal range of motion.  Edema: None  Mental Status: Normal mood and affect. Normal behavior. Normal judgment and thought content.   Assessment and Plan:  Pregnancy: G2P1001 at 6889w1d  1. Gestational diabetes mellitus (GDM) in third trimester, gestational diabetes method of control unspecified Patient did not bring CBG log. She reports highest fasting as 107 and highest pp 112 Growth ultrasound ordered today - US MFM OB DETAIL +14 WK; Future  2. Supervision of high risk pregnancy, antepartum Patient is doing well, reporting decreased fetal movement Patient is unaware that she missed several appointments IOL scheduled at 40 weeks Tdap today NST reviewed and  reactive with baseline 120, mod variability, + accels, no decels - US MFM OB DETAIL +14 WK; Future  Term labor symptoms and general obstetric precautions including but not limited to vaginal bleeding, contractions, leaking of fluid and fetal movement were reviewed in detail with the patient. Please refer to After Visit Summary for other counseling recommendations.  No Follow-up on file.   Catalina AntiguaPeggy Geniene List, MD

## 2017-01-25 NOTE — Addendum Note (Signed)
Addended by: Jill SideAY, Londen Lorge L on: 01/25/2017 04:44 PM   Modules accepted: Orders

## 2017-01-26 ENCOUNTER — Encounter (HOSPITAL_COMMUNITY): Payer: Self-pay

## 2017-01-26 ENCOUNTER — Ambulatory Visit (HOSPITAL_COMMUNITY)
Admission: RE | Admit: 2017-01-26 | Discharge: 2017-01-26 | Disposition: A | Discharge status: Home / Self Care | Payer: Medicaid Other | Source: Ambulatory Visit | Attending: Obstetrics and Gynecology | Admitting: Obstetrics and Gynecology

## 2017-01-26 ENCOUNTER — Other Ambulatory Visit: Payer: Self-pay | Admitting: Obstetrics and Gynecology

## 2017-01-26 ENCOUNTER — Encounter (HOSPITAL_COMMUNITY): Payer: Self-pay | Admitting: *Deleted

## 2017-01-26 ENCOUNTER — Telehealth (HOSPITAL_COMMUNITY): Payer: Self-pay | Admitting: *Deleted

## 2017-01-26 DIAGNOSIS — O0990 Supervision of high risk pregnancy, unspecified, unspecified trimester: Secondary | ICD-10-CM

## 2017-01-26 DIAGNOSIS — O0993 Supervision of high risk pregnancy, unspecified, third trimester: Secondary | ICD-10-CM | POA: Diagnosis not present

## 2017-01-26 DIAGNOSIS — O24419 Gestational diabetes mellitus in pregnancy, unspecified control: Secondary | ICD-10-CM

## 2017-01-26 DIAGNOSIS — Z3A39 39 weeks gestation of pregnancy: Secondary | ICD-10-CM | POA: Diagnosis not present

## 2017-01-26 DIAGNOSIS — O24410 Gestational diabetes mellitus in pregnancy, diet controlled: Secondary | ICD-10-CM | POA: Diagnosis present

## 2017-01-26 NOTE — Telephone Encounter (Signed)
Preadmission screen  

## 2017-01-28 ENCOUNTER — Encounter (HOSPITAL_COMMUNITY): Payer: Self-pay

## 2017-01-28 ENCOUNTER — Inpatient Hospital Stay (HOSPITAL_COMMUNITY)
Admission: AD | Admit: 2017-01-28 | Discharge: 2017-01-28 | Disposition: A | Discharge status: Home / Self Care | Payer: Medicaid Other | Source: Ambulatory Visit | Attending: Obstetrics & Gynecology | Admitting: Obstetrics & Gynecology

## 2017-01-28 DIAGNOSIS — O09893 Supervision of other high risk pregnancies, third trimester: Secondary | ICD-10-CM | POA: Insufficient documentation

## 2017-01-28 DIAGNOSIS — Z3A39 39 weeks gestation of pregnancy: Secondary | ICD-10-CM | POA: Insufficient documentation

## 2017-01-28 DIAGNOSIS — O368130 Decreased fetal movements, third trimester, not applicable or unspecified: Secondary | ICD-10-CM | POA: Diagnosis not present

## 2017-01-28 DIAGNOSIS — O24419 Gestational diabetes mellitus in pregnancy, unspecified control: Secondary | ICD-10-CM | POA: Diagnosis not present

## 2017-01-28 DIAGNOSIS — O0990 Supervision of high risk pregnancy, unspecified, unspecified trimester: Secondary | ICD-10-CM

## 2017-01-28 DIAGNOSIS — O24410 Gestational diabetes mellitus in pregnancy, diet controlled: Secondary | ICD-10-CM | POA: Insufficient documentation

## 2017-01-28 DIAGNOSIS — Z3689 Encounter for other specified antenatal screening: Secondary | ICD-10-CM

## 2017-01-28 LAB — GLUCOSE, CAPILLARY: Glucose-Capillary: 103 mg/dL — ABNORMAL HIGH (ref 65–99)

## 2017-01-28 NOTE — MAU Provider Note (Signed)
History     CSN: [id number]  Arrival date and time: 01/28/17 1403  Chief Complaint  Patient presents with  . Decreased Fetal Movement   G2P1001 @39 .4 wks here with decreased FM since last night. FM was nml yesterday and last night then felt only small amt of movement this am. No VB, LOF, or ctx. Pregnancy complicated by O0BBC. She did not check any BS today and reports BS yesterday 107 and 102.    OB History    Gravida Para Term Preterm AB Living   2 1 1  0 0 1   SAB TAB Ectopic Multiple Live Births   0 0 0 0 1      Past Medical History:  Diagnosis Date  . Acute blood loss anemia 01/07/2015  . Infection    kidney infection  . postpartum depression     Past Surgical History:  Procedure Laterality Date  . MOLE REMOVAL    . NO PAST SURGERIES      Family History  Problem Relation Age of Onset  . Hypertension Maternal Grandmother   . Thyroid disease Maternal Grandmother   . Hypertension Mother     Social History  Substance Use Topics  . Smoking status: Never Smoker  . Smokeless tobacco: Never Used  . Alcohol use No    Allergies: No Known Allergies  Prescriptions Prior to Admission  Medication Sig Dispense Refill Last Dose  . ferrous sulfate 325 (65 FE) MG tablet Take 1 tablet (325 mg total) by mouth daily. 30 tablet 1 01/27/2017 at Unknown time  . Prenatal Vit-Fe Fumarate-FA (PRENATAL MULTIVITAMIN) TABS tablet Take 1 tablet by mouth daily at 12 noon.   01/27/2017 at Unknown time  . ACCU-CHEK FASTCLIX LANCETS MISC 1 Device by Does not apply route 4 (four) times daily. 1 each 12 Taking  . Blood Glucose Monitoring Suppl (ACCU-CHEK GUIDE) w/Device KIT See admin instructions.  0 Taking  . glucose blood (ACCU-CHEK GUIDE) test strip 4 times daily 100 each 12 Taking    Review of Systems  Gastrointestinal: Negative for abdominal pain.  Genitourinary: Negative for vaginal bleeding.   Physical Exam   Last menstrual period 04/14/2016, unknown if currently  breastfeeding.  Physical Exam  Nursing note and vitals reviewed. Constitutional: She is oriented to person, place, and time. She appears well-developed and well-nourished. No distress.  HENT:  Head: Normocephalic and atraumatic.  Neck: Normal range of motion.  Respiratory: Effort normal. No respiratory distress.  GI: Soft. She exhibits no distension. There is no tenderness.  gravid  Musculoskeletal: Normal range of motion.  Neurological: She is alert and oriented to person, place, and time.  Skin: Skin is warm and dry.  Psychiatric: She has a normal mood and affect.   EFM: 135 bpm, mod variability, + accels, no decels Toco: irregular  Results for orders placed or performed during the hospital encounter of 01/28/17 (from the past 24 hour(s))  Glucose, capillary     Status: Abnormal   Collection Time: 01/28/17  2:43 PM  Result Value Ref Range   Glucose-Capillary 103 (H) 65 - 99 mg/dL   MAU Course  Procedures Po hydration  MDM Labs ordered and reviewed. Audible FM and pt reports 3+ FMs since EFM applied. NST reactive. Stable for discharge home.   Assessment and Plan   1. [redacted] weeks gestation of pregnancy   2. Supervision of high risk pregnancy, antepartum   3. NST (non-stress test) reactive   4. Decreased fetal movements in third trimester, single or  unspecified fetus    Discharge home Follow up in 3 days for IOL Daily [facility] Labor precautions  Allergies as of 01/28/2017   No Known Allergies     Medication List    TAKE these medications   ACCU-CHEK FASTCLIX LANCETS Misc 1 Device by Does not apply route 4 (four) times daily.   ACCU-CHEK GUIDE w/Device Kit See admin instructions.   ferrous sulfate 325 (65 FE) MG tablet Take 1 tablet (325 mg total) by mouth daily.   glucose blood test strip Commonly known as:  ACCU-CHEK GUIDE 4 times daily   prenatal multivitamin Tabs tablet Take 1 tablet by mouth daily at 12 noon.      Julianne Handler, CNM 01/28/2017, 2:59 PM

## 2017-01-28 NOTE — MAU Note (Signed)
Pt said she felt baby moving last night, very little this morning and nothing since.

## 2017-01-28 NOTE — Discharge Instructions (Signed)

## 2017-01-30 ENCOUNTER — Other Ambulatory Visit: Payer: Self-pay | Admitting: Obstetrics and Gynecology

## 2017-01-30 ENCOUNTER — Encounter: Payer: Self-pay | Admitting: Obstetrics and Gynecology

## 2017-01-31 ENCOUNTER — Inpatient Hospital Stay (HOSPITAL_COMMUNITY): Payer: Medicaid Other | Admitting: Anesthesiology

## 2017-01-31 ENCOUNTER — Inpatient Hospital Stay (HOSPITAL_COMMUNITY)
Admission: RE | Admit: 2017-01-31 | Discharge: 2017-02-01 | DRG: 775 | Disposition: A | Discharge status: Home / Self Care | Payer: Medicaid Other | Source: Ambulatory Visit | Attending: Obstetrics and Gynecology | Admitting: Obstetrics and Gynecology

## 2017-01-31 ENCOUNTER — Encounter (HOSPITAL_COMMUNITY): Payer: Self-pay

## 2017-01-31 DIAGNOSIS — O24420 Gestational diabetes mellitus in childbirth, diet controlled: Secondary | ICD-10-CM | POA: Diagnosis present

## 2017-01-31 DIAGNOSIS — O24419 Gestational diabetes mellitus in pregnancy, unspecified control: Secondary | ICD-10-CM | POA: Diagnosis present

## 2017-01-31 DIAGNOSIS — Z3A40 40 weeks gestation of pregnancy: Secondary | ICD-10-CM

## 2017-01-31 DIAGNOSIS — O24429 Gestational diabetes mellitus in childbirth, unspecified control: Secondary | ICD-10-CM | POA: Diagnosis not present

## 2017-01-31 DIAGNOSIS — O24410 Gestational diabetes mellitus in pregnancy, diet controlled: Secondary | ICD-10-CM | POA: Diagnosis present

## 2017-01-31 LAB — CBC
HEMATOCRIT: 31.6 % — AB (ref 36.0–46.0)
Hemoglobin: 10.3 g/dL — ABNORMAL LOW (ref 12.0–15.0)
MCH: 25.6 pg — ABNORMAL LOW (ref 26.0–34.0)
MCHC: 32.6 g/dL (ref 30.0–36.0)
MCV: 78.4 fL (ref 78.0–100.0)
Platelets: 283 10*3/uL (ref 150–400)
RBC: 4.03 MIL/uL (ref 3.87–5.11)
RDW: 16.3 % — ABNORMAL HIGH (ref 11.5–15.5)
WBC: 10.6 10*3/uL — AB (ref 4.0–10.5)

## 2017-01-31 LAB — TYPE AND SCREEN
ABO/RH(D): B POS
ANTIBODY SCREEN: NEGATIVE

## 2017-01-31 LAB — GLUCOSE, RANDOM: Glucose, Bld: 110 mg/dL — ABNORMAL HIGH (ref 65–99)

## 2017-01-31 LAB — RPR: RPR Ser Ql: NONREACTIVE

## 2017-01-31 MED ORDER — LIDOCAINE HCL (PF) 1 % IJ SOLN
30.0000 mL | INTRAMUSCULAR | Status: DC | PRN
  Filled 2017-01-31: qty 30

## 2017-01-31 MED ORDER — OXYTOCIN 40 UNITS IN LACTATED RINGERS INFUSION - SIMPLE MED
2.5000 [IU]/h | INTRAVENOUS | Status: DC
  Filled 2017-01-31: qty 1000

## 2017-01-31 MED ORDER — SOD CITRATE-CITRIC ACID 500-334 MG/5ML PO SOLN: 30.0000 mL | ORAL | Status: DC | PRN

## 2017-01-31 MED ORDER — FENTANYL 2.5 MCG/ML BUPIVACAINE 1/10 % EPIDURAL INFUSION (WH - ANES)
14.0000 mL/h | INTRAMUSCULAR | Status: DC | PRN
Start: 2017-01-31 — End: 2017-01-31
  Administered 2017-01-31: 14 mL/h via EPIDURAL
  Filled 2017-01-31: qty 100

## 2017-01-31 MED ORDER — OXYCODONE-ACETAMINOPHEN 5-325 MG PO TABS: 1.0000 | ORAL_TABLET | ORAL | Status: DC | PRN

## 2017-01-31 MED ORDER — SIMETHICONE 80 MG PO CHEW: 80.0000 mg | CHEWABLE_TABLET | ORAL | Status: DC | PRN

## 2017-01-31 MED ORDER — PRENATAL MULTIVITAMIN CH
1.0000 | ORAL_TABLET | Freq: Every day | ORAL | Status: DC
  Administered 2017-02-01: 1 via ORAL
  Filled 2017-01-31: qty 1

## 2017-01-31 MED ORDER — COCONUT OIL OIL: 1.0000 "application " | TOPICAL_OIL | Status: DC | PRN

## 2017-01-31 MED ORDER — PHENYLEPHRINE 40 MCG/ML (10ML) SYRINGE FOR IV PUSH (FOR BLOOD PRESSURE SUPPORT)
80.0000 ug | PREFILLED_SYRINGE | INTRAVENOUS | Status: DC | PRN
  Filled 2017-01-31: qty 5
  Filled 2017-01-31: qty 10

## 2017-01-31 MED ORDER — OXYTOCIN BOLUS FROM INFUSION: 500.0000 mL | Freq: Once | INTRAVENOUS | Status: DC

## 2017-01-31 MED ORDER — EPHEDRINE 5 MG/ML INJ
10.0000 mg | INTRAVENOUS | Status: DC | PRN
  Filled 2017-01-31: qty 2

## 2017-01-31 MED ORDER — DIPHENHYDRAMINE HCL 50 MG/ML IJ SOLN: 12.5000 mg | INTRAMUSCULAR | Status: DC | PRN

## 2017-01-31 MED ORDER — DIBUCAINE 1 % RE OINT: 1.0000 "application " | TOPICAL_OINTMENT | RECTAL | Status: DC | PRN

## 2017-01-31 MED ORDER — TERBUTALINE SULFATE 1 MG/ML IJ SOLN
0.2500 mg | Freq: Once | INTRAMUSCULAR | Status: DC | PRN
  Filled 2017-01-31: qty 1

## 2017-01-31 MED ORDER — TETANUS-DIPHTH-ACELL PERTUSSIS 5-2.5-18.5 LF-MCG/0.5 IM SUSP: 0.5000 mL | Freq: Once | INTRAMUSCULAR | Status: DC

## 2017-01-31 MED ORDER — ONDANSETRON HCL 4 MG/2ML IJ SOLN: 4.0000 mg | Freq: Four times a day (QID) | INTRAMUSCULAR | Status: DC | PRN

## 2017-01-31 MED ORDER — ACETAMINOPHEN 325 MG PO TABS: 650.0000 mg | ORAL_TABLET | ORAL | Status: DC | PRN

## 2017-01-31 MED ORDER — LACTATED RINGERS IV SOLN: 500.0000 mL | INTRAVENOUS | Status: DC | PRN

## 2017-01-31 MED ORDER — BENZOCAINE-MENTHOL 20-0.5 % EX AERO: 1.0000 "application " | INHALATION_SPRAY | CUTANEOUS | Status: DC | PRN

## 2017-01-31 MED ORDER — OXYTOCIN 40 UNITS IN LACTATED RINGERS INFUSION - SIMPLE MED: 2.5000 [IU]/h | INTRAVENOUS | Status: DC

## 2017-01-31 MED ORDER — LACTATED RINGERS IV SOLN
500.0000 mL | Freq: Once | INTRAVENOUS | Status: AC
  Administered 2017-01-31: 500 mL via INTRAVENOUS

## 2017-01-31 MED ORDER — WITCH HAZEL-GLYCERIN EX PADS: 1.0000 "application " | MEDICATED_PAD | CUTANEOUS | Status: DC | PRN

## 2017-01-31 MED ORDER — ZOLPIDEM TARTRATE 5 MG PO TABS: 5.0000 mg | ORAL_TABLET | Freq: Every evening | ORAL | Status: DC | PRN

## 2017-01-31 MED ORDER — PHENYLEPHRINE 40 MCG/ML (10ML) SYRINGE FOR IV PUSH (FOR BLOOD PRESSURE SUPPORT)
80.0000 ug | PREFILLED_SYRINGE | INTRAVENOUS | Status: DC | PRN
  Filled 2017-01-31: qty 5

## 2017-01-31 MED ORDER — LACTATED RINGERS IV SOLN: INTRAVENOUS | Status: DC

## 2017-01-31 MED ORDER — LIDOCAINE HCL (PF) 1 % IJ SOLN
INTRAMUSCULAR | Status: DC | PRN
  Administered 2017-01-31: 13 mL via EPIDURAL

## 2017-01-31 MED ORDER — OXYCODONE-ACETAMINOPHEN 5-325 MG PO TABS: 2.0000 | ORAL_TABLET | ORAL | Status: DC | PRN

## 2017-01-31 MED ORDER — DIPHENHYDRAMINE HCL 25 MG PO CAPS: 25.0000 mg | ORAL_CAPSULE | Freq: Four times a day (QID) | ORAL | Status: DC | PRN

## 2017-01-31 MED ORDER — MEASLES, MUMPS & RUBELLA VAC ~~LOC~~ INJ: 0.5000 mL | INJECTION | Freq: Once | SUBCUTANEOUS | Status: DC

## 2017-01-31 MED ORDER — IBUPROFEN 600 MG PO TABS
600.0000 mg | ORAL_TABLET | Freq: Four times a day (QID) | ORAL | Status: DC
  Administered 2017-01-31 – 2017-02-01 (×5): 600 mg via ORAL
  Filled 2017-01-31 (×5): qty 1

## 2017-01-31 MED ORDER — FENTANYL CITRATE (PF) 100 MCG/2ML IJ SOLN
50.0000 ug | INTRAMUSCULAR | Status: DC | PRN
Start: 2017-01-31 — End: 2017-01-31

## 2017-01-31 MED ORDER — ONDANSETRON HCL 4 MG PO TABS: 4.0000 mg | ORAL_TABLET | ORAL | Status: DC | PRN

## 2017-01-31 MED ORDER — MISOPROSTOL 25 MCG QUARTER TABLET
25.0000 ug | ORAL_TABLET | ORAL | Status: DC | PRN
  Administered 2017-01-31: 25 ug via VAGINAL
  Filled 2017-01-31 (×2): qty 1

## 2017-01-31 MED ORDER — SENNOSIDES-DOCUSATE SODIUM 8.6-50 MG PO TABS
2.0000 | ORAL_TABLET | ORAL | Status: DC
  Administered 2017-01-31: 2 via ORAL
  Filled 2017-01-31: qty 2

## 2017-01-31 MED ORDER — ONDANSETRON HCL 4 MG/2ML IJ SOLN: 4.0000 mg | INTRAMUSCULAR | Status: DC | PRN

## 2017-01-31 NOTE — H&P (Addendum)
LABOR AND DELIVERY ADMISSION HISTORY AND PHYSICAL NOTE  Brooke Howard is a 26 y.o. female G2P1001 with IUP at 15w0dby 7 wk u/s presenting for iol for a1gdm.   She reports positive fetal movement. She denies leakage of fluid or vaginal bleeding.  Prenatal History/Complications:  Past Medical History: Past Medical History:  Diagnosis Date  . Acute blood loss anemia 01/07/2015  . Infection    kidney infection  . postpartum depression     Past Surgical History: Past Surgical History:  Procedure Laterality Date  . MOLE REMOVAL      Obstetrical History: OB History    Gravida Para Term Preterm AB Living   2 1 1  0 0 1   SAB TAB Ectopic Multiple Live Births   0 0 0 0 1      Social History: Social History   Social History  . Marital status: Single    Spouse name: N/A  . Number of children: N/A  . Years of education: N/A   Social History Main Topics  . Smoking status: Never Smoker  . Smokeless tobacco: Never Used  . Alcohol use No  . Drug use: No  . Sexual activity: Yes    Birth control/ protection: None   Other Topics Concern  . None   Social History Narrative  . None    Family History: Family History  Problem Relation Age of Onset  . Hypertension Maternal Grandmother   . Thyroid disease Maternal Grandmother   . Hypertension Mother     Allergies: No Known Allergies  Prescriptions Prior to Admission  Medication Sig Dispense Refill Last Dose  . ACCU-CHEK FASTCLIX LANCETS MISC 1 Device by Does not apply route 4 (four) times daily. 1 each 12 Taking  . Blood Glucose Monitoring Suppl (ACCU-CHEK GUIDE) w/Device KIT See admin instructions.  0 Taking  . ferrous sulfate 325 (65 FE) MG tablet Take 1 tablet (325 mg total) by mouth daily. 30 tablet 1 01/27/2017 at Unknown time  . glucose blood (ACCU-CHEK GUIDE) test strip 4 times daily 100 each 12 Taking  . Prenatal Vit-Fe Fumarate-FA (PRENATAL MULTIVITAMIN) TABS tablet Take 1 tablet by mouth daily at 12 noon.   01/27/2017  at Unknown time     Review of Systems   All systems reviewed and negative except as stated in HPI  Blood pressure 114/71, pulse 98, temperature 98.4 F (36.9 C), temperature source Oral, resp. rate 20, height 5' 5"  (1.651 m), weight 179 lb (81.2 kg), last menstrual period 04/14/2016, unknown if currently breastfeeding. General appearance: alert, cooperative and appears stated age Lungs: clear to auscultation bilaterally Heart: regular rate and rhythm Abdomen: soft, non-tender; bowel sounds normal Extremities: No calf swelling or tenderness Presentation: cephalic Fetal monitoring: 140/mod/-a/0d Uterine activity: quiet Dilation: 1.5 Effacement (%): 20 Station: -3 Exam by:: Dr. WIrena Cords  Prenatal labs: ABO, Rh: B/Positive/-- (11/16 0000) Antibody: Negative (11/16 0000) Rubella: o,, RPR: Nonreactive (05/02 0000)  HBsAg: Negative (11/16 0000)  HIV: Non Reactive (10/25 1315)  GBS: Negative (05/10 0000)  Glucola: fail Genetic screening:  Not performed Anatomy [place] wnl  Prenatal Transfer Tool  Maternal Diabetes: yes, diet controlled Genetic Screening: Declined Maternal Ultrasounds/Referrals: Normal Fetal Ultrasounds or other Referrals:  None Maternal Substance Abuse:  No Significant Maternal Medications:  None Significant Maternal Lab Results: Lab values include: Group B Strep negative  Results for orders placed or performed during the hospital encounter of 01/31/17 (from the past 24 hour(s))  CBC   Collection Time: 01/31/17  8:15  AM  Result Value Ref Range   WBC 10.6 (H) 4.0 - 10.5 K/uL   RBC 4.03 3.87 - 5.11 MIL/uL   Hemoglobin 10.3 (L) 12.0 - 15.0 g/dL   HCT 31.6 (L) 36.0 - 46.0 %   MCV 78.4 78.0 - 100.0 fL   MCH 25.6 (L) 26.0 - 34.0 pg   MCHC 32.6 30.0 - 36.0 g/dL   RDW 16.3 (H) 11.5 - 15.5 %   Platelets 283 150 - 400 K/uL    Patient Active Problem List   Diagnosis Date Noted  . Gestational diabetes 01/31/2017  . GDM, class A1 01/31/2017  . Supervision of high  risk pregnancy, antepartum 01/09/2017  . Gestational diabetes mellitus 01/09/2017    Assessment: Brooke Howard is a 26 y.o. G2P1001 at 24w0dhere for iol for a1gdm  #Labor: start cytotec, foley placed #Pain: Eventual epidural #FWB: Cat 1 #ID:  gbs neg #MOF: br and bot #MOC:paraguard #Circ:  N/a #A1gdm: efw 63% @ 39+2, will check glucose q4 hours to start  NDesma Maxim6/07/2017, 8:51 AM

## 2017-01-31 NOTE — Progress Notes (Signed)
CSW acknowledges consultand passed information to hospital's case management.  CM will follow-up with patient after L&D.  Blaine HamperAngel Boyd-Gilyard, MSW, LCSW Clinical Social Work [phone]

## 2017-01-31 NOTE — Anesthesia Procedure Notes (Signed)
Epidural Patient location during procedure: OB Start time: 01/31/2017 2:06 PM End time: 01/31/2017 2:20 PM  Staffing Anesthesiologist: Anitra LauthMILLER, Kahle Mcqueen RAY Performed: anesthesiologist   Preanesthetic Checklist Completed: patient identified, site marked, surgical consent, pre-op evaluation, timeout performed, IV checked, risks and benefits discussed and monitors and equipment checked  Epidural Patient position: sitting Prep: DuraPrep Patient monitoring: heart rate, cardiac monitor, continuous pulse ox and blood pressure Approach: midline Location: L2-L3 Injection technique: LOR saline  Needle:  Needle type: Tuohy  Needle gauge: 17 G Needle length: 9 cm Needle insertion depth: 5 cm Catheter type: closed end flexible Catheter size: 20 Guage Catheter at skin depth: 8 cm Test dose: negative  Assessment Events: blood not aspirated, injection not painful, no injection resistance, negative IV test and no paresthesia  Additional Notes Reason for block:procedure for pain

## 2017-01-31 NOTE — Anesthesia Preprocedure Evaluation (Signed)
Anesthesia Evaluation  Patient identified by MRN, date of birth, ID band Patient awake    Reviewed: Allergy & Precautions, H&P , NPO status , Patient's Chart, lab work & pertinent test results  Airway Mallampati: I  TM Distance: >3 FB Neck ROM: full    Dental no notable dental hx.    Pulmonary neg pulmonary ROS,    Pulmonary exam normal        Cardiovascular negative cardio ROS Normal cardiovascular exam     Neuro/Psych negative neurological ROS  negative psych ROS   GI/Hepatic negative GI ROS, Neg liver ROS,   Endo/Other  negative endocrine ROS  Renal/GU negative Renal ROS     Musculoskeletal   Abdominal Normal abdominal exam  (+)   Peds  Hematology negative hematology ROS (+)   Anesthesia Other Findings   Reproductive/Obstetrics (+) Pregnancy                             Anesthesia Physical  Anesthesia Plan  ASA: II  Anesthesia Plan: Epidural   Post-op Pain Management:    Induction:   PONV Risk Score and Plan:   Airway Management Planned:   Additional Equipment:   Intra-op Plan:   Post-operative Plan:   Informed Consent: I have reviewed the patients History and Physical, chart, labs and discussed the procedure including the risks, benefits and alternatives for the proposed anesthesia with the patient or authorized representative who has indicated his/her understanding and acceptance.     Plan Discussed with:   Anesthesia Plan Comments:         Anesthesia Quick Evaluation  

## 2017-01-31 NOTE — Anesthesia Pain Management Evaluation Note (Signed)
  CRNA Pain Management Visit Note  Patient: Brooke Howard, 26 y.o., female  "Hello I am a [facility] of the anesthesia [facility] at [facility]. We have an anesthesia team available at all times to provide care throughout the hospital, including epidural management and anesthesia for C-section. I don't know your plan for the delivery whether it a natural birth, water birth, IV sedation, nitrous supplementation, doula or epidural, but we want to meet your pain goals."   1.Was your pain managed to your expectations on prior hospitalizations?   Yes   2.What is your expectation for pain management during this hospitalization?     Epidural  3.How can we help you reach that goal? epidural  Record the patient's initial score and the patient's pain goal.   Pain: 2  Pain Goal: 6 [facility] wants you to be able to say your pain was always managed very well.  Madison HickmanGREGORY,Demarie Uhlig 01/31/2017

## 2017-01-31 NOTE — Progress Notes (Addendum)
iol for a1gdm. Now in active labor. Called to room for feeling of intense vaginal pressure. 140/mod/-a/-d. Complete, bulging bag, arom mec positive. Will start pushing.

## 2017-02-01 NOTE — Discharge Summary (Signed)
OB Discharge Summary     Patient Name: Brooke Howard DOB: 05/18/1991 MRN: [id number]  Date of admission: 01/31/2017 Delivering MD: Shonna ChockWOUK, NOAH BEDFORD   Date of discharge: 02/01/2017  Admitting diagnosis: INDUCTION Intrauterine pregnancy: 1949w0d     Secondary diagnosis:  Active Problems:   Gestational diabetes   GDM, class A1  Additional problems: None     Discharge diagnosis: Term Pregnancy Delivered                                                                                                Post partum procedures:none  Augmentation: AROM, Pitocin and Cytotec  Complications: None  Hospital course:  Onset of Labor With Vaginal Delivery     26 y.o. yo [id number] at 3249w0d was admitted in Latent Labor on 01/31/2017. Patient had an uncomplicated labor course as follows:  Membrane Rupture Time/Date: 3:50 PM ,01/31/2017   Intrapartum Procedures: Episiotomy: None [1]                                         Lacerations:  None [1]  Patient had a delivery of a Viable infant. 01/31/2017  Information for the patient's newborn:  Renne CriglerDiaz, Girl Yurianna [[id number]][[id number]]  Delivery Method: Vaginal, Spontaneous Delivery (Filed from Delivery Summary)    Pateint had an uncomplicated postpartum course.  She is ambulating, tolerating a regular diet, passing flatus, and urinating well. Patient is discharged home in stable condition on 02/01/17.   Physical exam  Vitals:   01/31/17 1750 01/31/17 1900 01/31/17 2300 02/01/17 0620  BP: 116/62 114/67 103/67 114/75  Pulse: 75 65 85 81  Resp: 16 18 18 18   Temp: 98.9 F (37.2 C) 98.1 F (36.7 C) 98.7 F (37.1 C) 98.6 F (37 C)  TempSrc: Oral Oral Oral Oral  Weight:      Height:       General: alert, cooperative and no distress Lochia: appropriate Uterine Fundus: firm Incision: N/A DVT Evaluation: No evidence of DVT seen on physical exam. Labs: Lab Results  Component Value Date   WBC 10.6 (H) 01/31/2017   HGB 10.3 (L) 01/31/2017   HCT 31.6 (L)  01/31/2017   MCV 78.4 01/31/2017   PLT 283 01/31/2017   CMP Latest Ref Rng & Units 01/31/2017  Glucose 65 - 99 mg/dL 130(Q110(H)  BUN 6 - 20 mg/dL -  Creatinine 6.570.44 - 8.461.00 mg/dL -  Sodium 962135 - 952145 mmol/L -  Potassium 3.5 - 5.1 mmol/L -  Chloride 101 - 111 mmol/L -  CO2 22 - 32 mmol/L -  Calcium 8.9 - 10.3 mg/dL -  Total Protein 6.5 - 8.1 g/dL -  Total Bilirubin 0.3 - 1.2 mg/dL -  Alkaline Phos 38 - 841126 U/L -  AST 15 - 41 U/L -  ALT 14 - 54 U/L -    Discharge instruction: per After Visit Summary and "Baby and Me Booklet".  After visit meds:  Allergies as of 02/01/2017   No Known Allergies  Diet: routine diet  Activity: Advance as tolerated. Pelvic rest for 6 weeks.   Outpatient follow up:6 weeks Follow up Appt: Future Appointments Date Time Provider Department Center  03/08/2017 10:00 AM Aviva Signs, CNM [facility]   Follow up Visit:No Follow-up on file.  Postpartum contraception: IUD Paragard  Newborn Data: Live born female  Birth Weight: 8 lb 2.5 oz (3700 g) APGAR: 9, 9  Baby Feeding: Breast Disposition:home with mother   02/01/2017 Marylene Land, CNM

## 2017-02-01 NOTE — Progress Notes (Signed)
  CSW received consult due to score greater than 9, or positive for SI on [place] Depression Screen.    CSW provided education regarding Baby Blues vs PMADs and provided MOB with information about support groups held at [facility].  CSW encouraged MOB to evaluate her mental health throughout the postpartum period with the use of the New Mom Checklist developed by Postpartum Progress and notify a medical professional if symptoms arise.   CSW identifies no further need for intervention at this time or barriers to discharge  Brooke Howard, MSW, LCSW-A Clinical Social Worker  [facility]  Office: [phone]

## 2017-02-01 NOTE — Lactation Note (Signed)
This note was copied from a baby's chart. Lactation Consultation Note Mom Spanish speaking, speaks some English, asked mom if she needed an interpreter, mom stated "NO" that she would ask for one if she needed one. Mom has 28 yr old that she BF for 6 months, breast/fromula. Mom stated she didn't think the baby liked the breast that much. Stated this baby likes it.  Mom has everted nipples. Mom hand expressed colostrum. Discussed positions for feeding, mom has done cradle and side lying. Mom stated baby Bf for short time then goes to sleep. Baby had clothes on, encouraged to BF STS, explained benefits. Educated about, cluster feeding, newborn feeding habits, I&O, supply and demand. Mom encouraged to feed baby 8-12 times/24 hours and with feeding cues. Mom encouraged to waken baby for feeds.  [facility] brochure given w/resources, support groups and LC services. Mom has WIC Encouraged to call for questions or assistance. Patient Name: Brooke Howard ZOXWR'UToday's Date: 02/01/2017 Reason for consult: Initial assessment   Maternal Data Has patient been taught Hand Expression?: Yes Does the patient have breastfeeding experience prior to this delivery?: Yes  Feeding Feeding Type: Breast Fed Length of feed: 10 min  LATCH Score/Interventions       Type of Nipple: Everted at rest and after stimulation  Comfort (Breast/Nipple): Soft / non-tender     Intervention(s): Breastfeeding basics reviewed;Support Pillows;Position options;Skin to skin     Lactation Tools Discussed/Used WIC Program: Yes   Consult Status Consult Status: Follow-up Date: 02/02/17 Follow-up type: In-patient    Charyl DancerCARVER, Laticha Ferrucci G 02/01/2017, 6:24 AM

## 2017-02-01 NOTE — Progress Notes (Signed)
Patient will like to order her own food. Brooke Howard Interpreter.

## 2017-02-01 NOTE — Anesthesia Postprocedure Evaluation (Signed)
Anesthesia Post Note  Patient: Brooke Howard  Procedure(s) Performed: * No procedures listed *     Patient location during evaluation: Mother Baby Anesthesia Type: Epidural Level of consciousness: awake Pain management: pain level controlled Vital Signs Assessment: post-procedure vital signs reviewed and stable Respiratory status: spontaneous breathing Cardiovascular status: stable Postop Assessment: no headache, no backache, epidural receding, patient able to bend at knees, no signs of nausea or vomiting and adequate PO intake Anesthetic complications: no    Last Vitals:  Vitals:   01/31/17 2300 02/01/17 0620  BP: 103/67 114/75  Pulse: 85 81  Resp: 18 18  Temp: 37.1 C 37 C    Last Pain:  Vitals:   02/01/17 0620  TempSrc: Oral  PainSc: 6    Pain Goal: Patients Stated Pain Goal: 8 (01/31/17 0800)               Fanny DanceMULLINS,Alfonzia Woolum

## 2017-03-08 ENCOUNTER — Ambulatory Visit: Payer: Medicaid Other | Admitting: Advanced Practice Midwife

## 2017-03-08 ENCOUNTER — Telehealth: Payer: Self-pay | Admitting: General Practice

## 2017-03-08 NOTE — Telephone Encounter (Signed)
Called and left message on voice mail in regards to patient missed Postpartum visit.  We have patient rescheduled for 03/29/17 at 8:40am with Venia CarbonJennifer Rasch, NP.  Asked patient to give our office a call if she is unable to keep this appointment.

## 2017-03-29 ENCOUNTER — Ambulatory Visit: Payer: Medicaid Other | Admitting: Obstetrics and Gynecology

## 2017-04-12 ENCOUNTER — Ambulatory Visit: Payer: Medicaid Other | Admitting: Advanced Practice Midwife

## 2017-08-03 IMAGING — US US MFM OB DETAIL+14 WK
1 series · 13 of 28 positions shown · non-contrast
Comparison: none

[Series 1: us mfm ob detail+14 wk · 41 acquisitions, 13 frames shown]
[im 2/41]
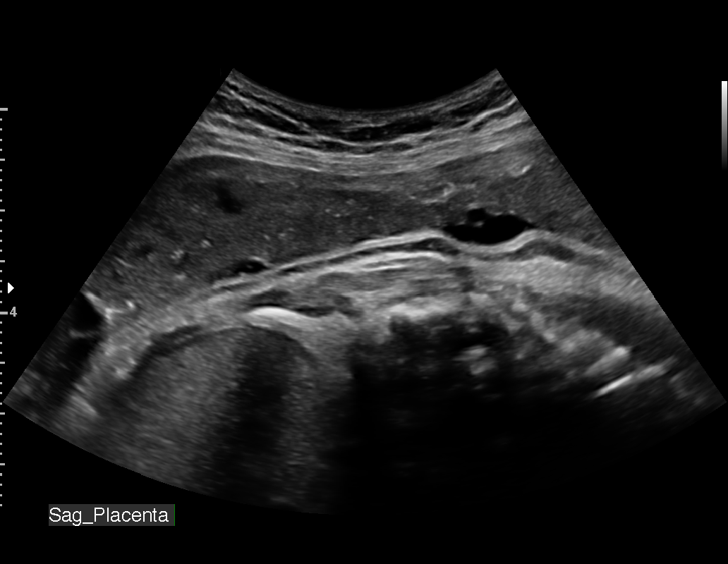
[im 5/41]
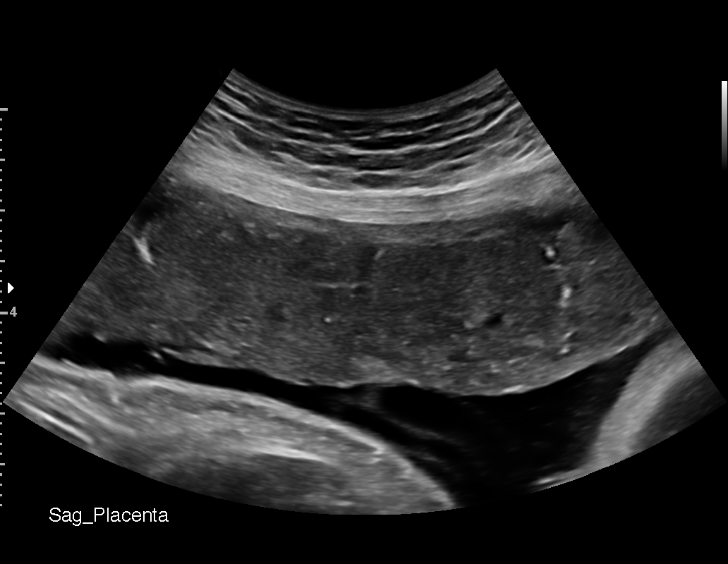
[im 8/41]
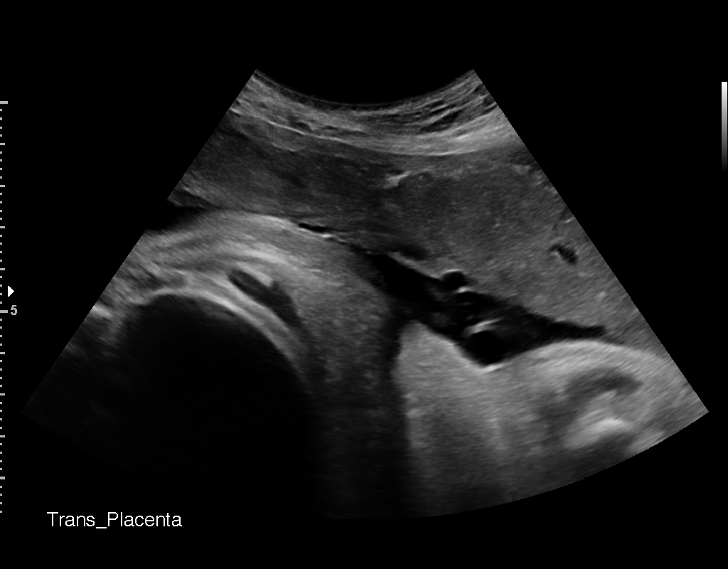
[im 11/41]
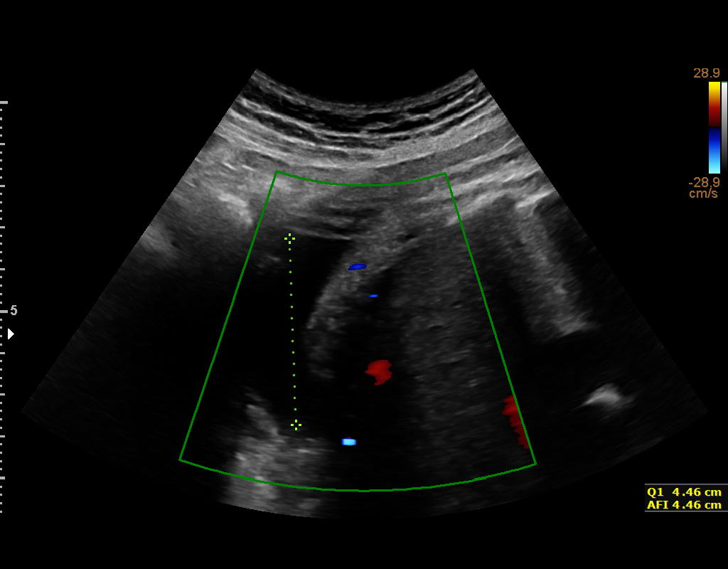
[im 14/41]
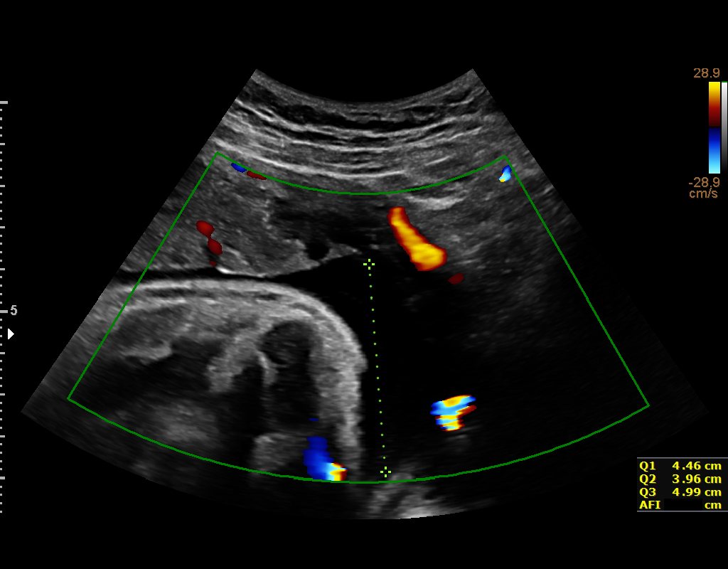
[im 17/41]
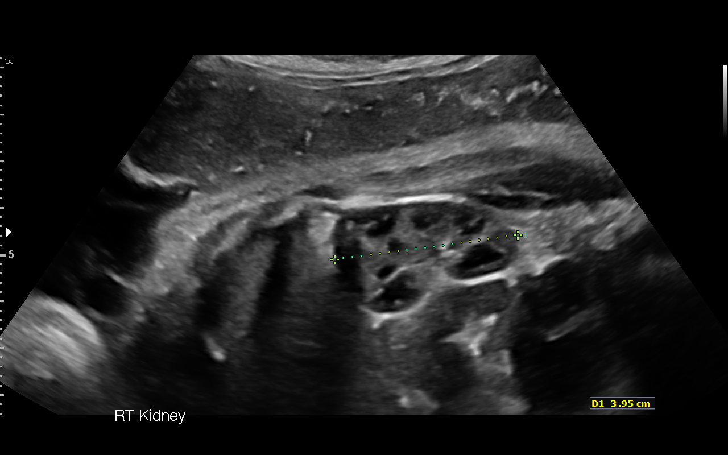
[im 21/41]
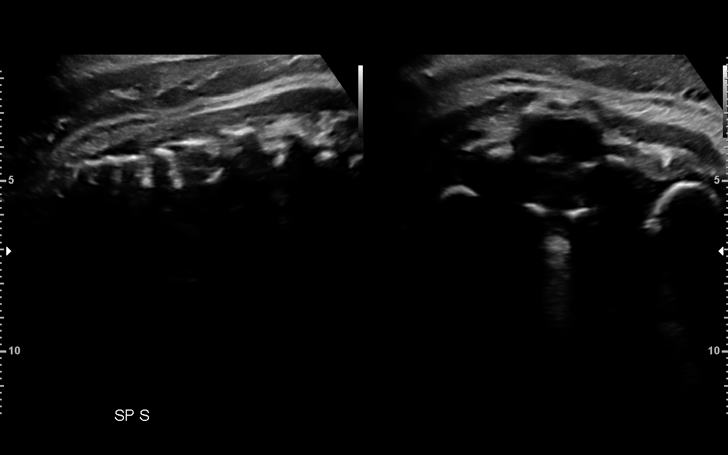
[im 24/41]
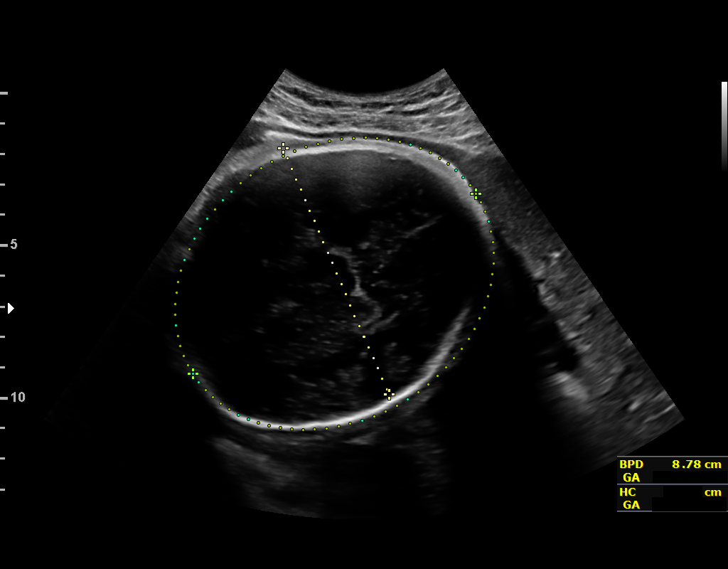
[im 27/41]
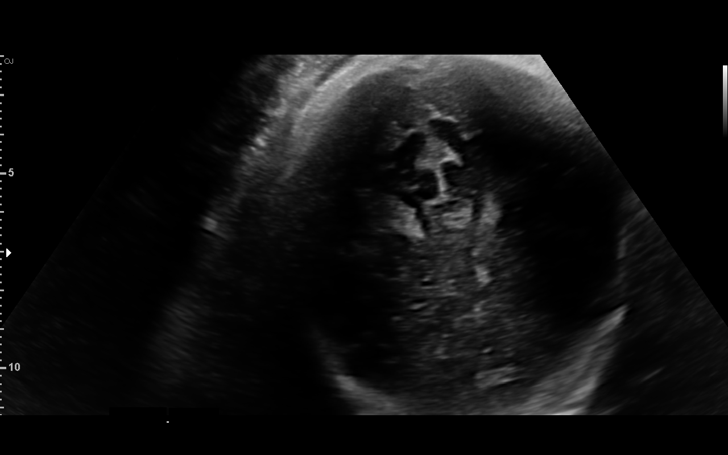
[im 30/41]
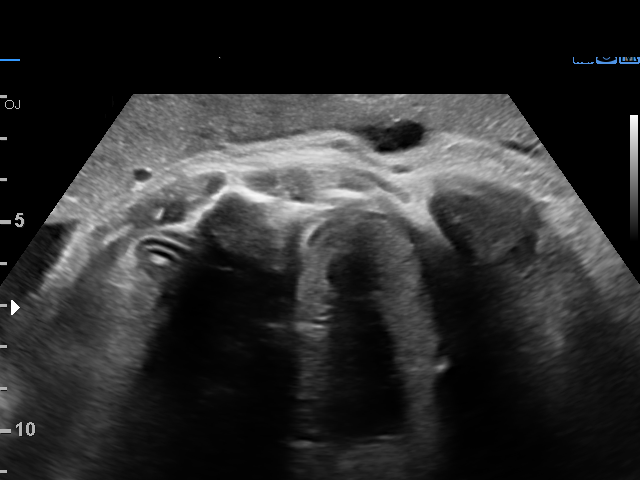
[im 33/41]
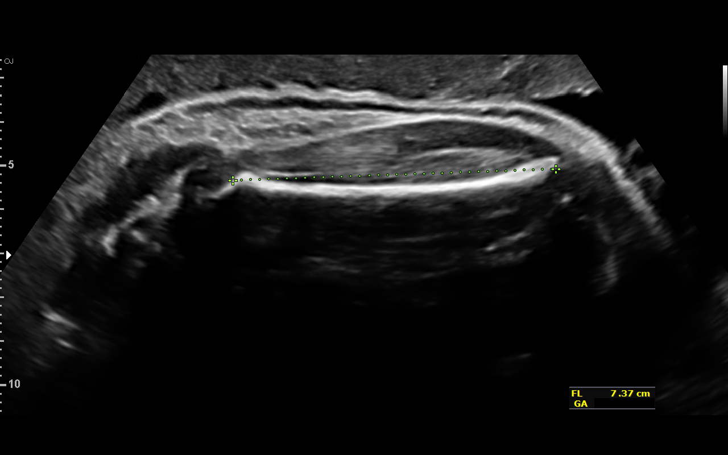
[im 36/41]
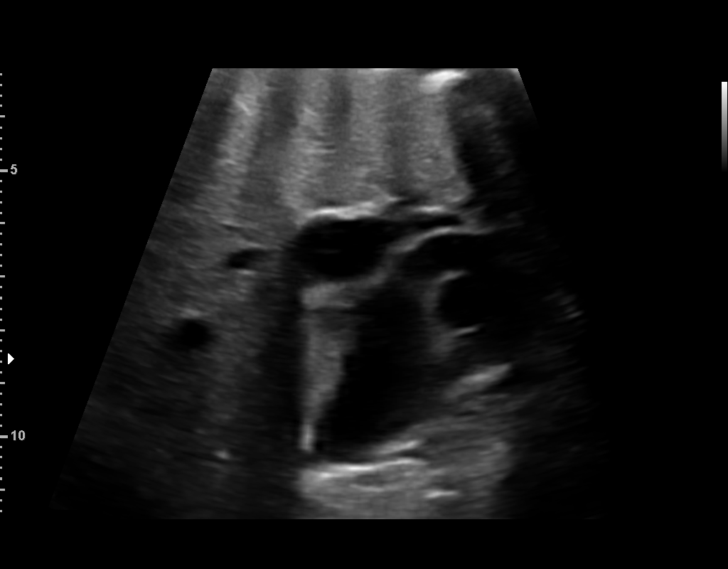
[im 39/41]
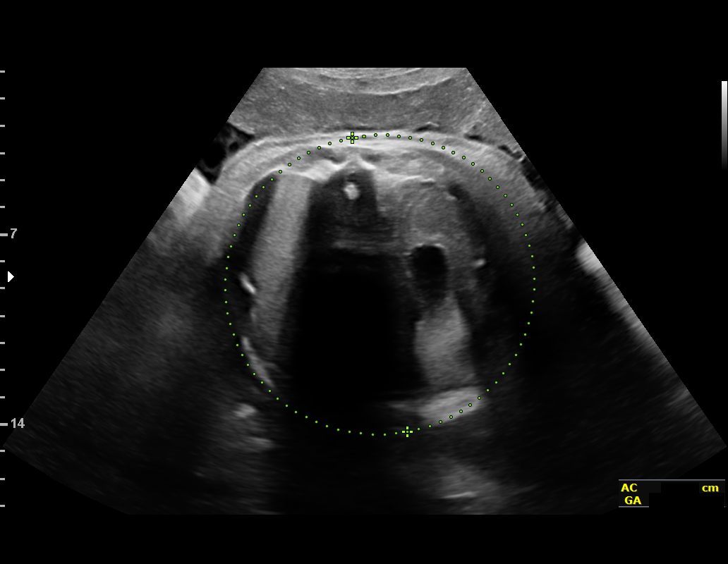

[13 of 28 positions shown; findings below may reference images not displayed]

Health
NDAZHA

1  REKHA ASHMAN           [id number]      [id number]     [id number]
Indications

39 weeks gestation of pregnancy
Encounter for antenatal screening for
malformations
Gestational diabetes in pregnancy, diet
controlled
OB History

Gravidity:    2         Term:   1
Living:       1
Fetal Evaluation

Num Of Fetuses:     1
Fetal Heart         150
Rate(bpm):
Cardiac Activity:   Observed
Presentation:       Cephalic
Placenta:           Anterior, above cervical os
P. Cord Insertion:  Visualized

Amniotic Fluid
AFI FV:      Subjectively within normal limits

AFI Sum(cm)     %Tile       Largest Pocket(cm)
13.41           54

RUQ(cm)                     LUQ(cm)        LLQ(cm)
4.46
Biometry

BPD:      87.8  mm     G. Age:  35w 4d          3  %    CI:        78.79   %    70 - 86
FL/HC:      23.6   %    20.6 -
HC:      312.8  mm     G. Age:  35w 0d        < 3  %    HC/AC:      0.88        0.87 -
AC:      353.9  mm     G. Age:  39w 2d         73  %    FL/BPD:     84.2   %    71 - 87
FL:       73.9  mm     G. Age:  37w 6d         23  %    FL/AC:      20.9   %    20 - 24

Est. FW:    4490  gm      7 lb 6 oz     63  %
Gestational Age

LMP:           41w 0d        Date:  04/14/16                 EDD:   01/19/17
U/S Today:     37w 0d                                        EDD:   02/16/17
Best:          39w 2d     Det. By:  Early Ultrasound         EDD:   01/31/17
(06/15/16)
Anatomy

Cranium:               Appears normal         Aortic Arch:            Not well visualized
Cavum:                 Not well visualized    Ductal Arch:            Not well visualized
Ventricles:            Appears normal         Diaphragm:              Not well visualized
Choroid Plexus:        Appears normal         Stomach:                Appears normal, left
sided
Cerebellum:            Not well visualized    Abdomen:                Appears normal
Posterior Fossa:       Previously seen        Abdominal Wall:         Not well visualized
Nuchal Fold:           Not applicable (>20    Cord Vessels:           Not well visualized
wks GA)
Face:                  Not well visualized    Kidneys:                Appear normal
Lips:                  Not well visualized    Bladder:                Appears normal
Thoracic:              Appears normal         Spine:                  Appears normal
Heart:                 Not well visualized    Upper Extremities:      Not well visualized
RVOT:                  Not well visualized    Lower Extremities:      Not well visualized
LVOT:                  Appears normal

Other:  Gender not well visualized. Technically difficult due to advanced
gestational age.
Cervix Uterus Adnexa

Cervix
Not visualized (advanced GA >85wks)

Uterus
No abnormality visualized.

Left Ovary
Not visualized. No adnexal mass visualized.

Right Ovary
Not visualized. No adnexal mass visualized.
Impression

Singleton intrauterine pregnancy at 39+2 weeks with GDM;
here for anatomic survey and growth evaluation
Review of the anatomy shows no sonographic markers for
aneuploidy or structural anomalies
However, at this gestational age it is practically impossible to
clear the intracranial or cardiac system
Amniotic fluid volume is normal with an AFI of 13.4cm
Estimated fetal weight is 3359g which is growth in the 63rd
percentile
Recommendations

It is my understanding the patient will be induced at 40 weeks.
Follow-up ultrasounds as clinically indicated.

## 2017-10-10 ENCOUNTER — Emergency Department (HOSPITAL_COMMUNITY)
Admission: EM | Admit: 2017-10-10 | Discharge: 2017-10-10 | Disposition: A | Discharge status: Home / Self Care | Payer: Medicaid Other | Attending: Emergency Medicine | Admitting: Emergency Medicine

## 2017-10-10 ENCOUNTER — Other Ambulatory Visit: Payer: Self-pay

## 2017-10-10 ENCOUNTER — Encounter (HOSPITAL_COMMUNITY): Payer: Self-pay

## 2017-10-10 DIAGNOSIS — L989 Disorder of the skin and subcutaneous tissue, unspecified: Secondary | ICD-10-CM | POA: Insufficient documentation

## 2017-10-10 MED ORDER — CEPHALEXIN 500 MG PO CAPS: 500.0000 mg | ORAL_CAPSULE | Freq: Four times a day (QID) | ORAL | 0 refills | Status: AC

## 2017-10-10 NOTE — Discharge Instructions (Signed)
Please read attached information. If you experience any new or worsening signs or symptoms please return to the emergency room for evaluation. Please follow-up with your primary care provider or specialist as discussed. Please use medication prescribed only as directed and discontinue taking if you have any concerning signs or symptoms.   °

## 2017-10-10 NOTE — ED Notes (Signed)
Pt unable to sign for discharge, sign pad not working

## 2017-10-10 NOTE — ED Triage Notes (Signed)
Per Pt, Pt is coming from home with complaints of wound noted on her nose three weeks ago that has just gotten worse.

## 2017-10-10 NOTE — ED Provider Notes (Signed)
[facility] EMERGENCY DEPARTMENT Provider Note   CSN: [id number] Arrival date & time: 10/10/17  1633     History   Chief Complaint Chief Complaint  Patient presents with  . Wound Check    HPI Brooke Howard is a 27 y.o. female.  HPI   27 year old female presents today with complaints of facial lesion.  Patient notes a lesion to the right upper nose that has slowly progressed over the last 3 weeks.  She notes it started as a small area of redness that has expanded.  She notes some clear oozing, no purulent drainage, denies any fever.   Past Medical History:  Diagnosis Date  . Acute blood loss anemia 01/07/2015  . Infection    kidney infection  . postpartum depression     Patient Active Problem List   Diagnosis Date Noted  . Gestational diabetes 01/31/2017  . GDM, class A1 01/31/2017  . Supervision of high risk pregnancy, antepartum 01/09/2017  . Gestational diabetes mellitus 01/09/2017    Past Surgical History:  Procedure Laterality Date  . MOLE REMOVAL      OB History    Gravida Para Term Preterm AB Living   2 2 2  0 0 2   SAB TAB Ectopic Multiple Live Births   0 0 0 0 2       Home Medications    Prior to Admission medications   Medication Sig Start Date End Date Taking? Authorizing Provider  cephALEXin (KEFLEX) 500 MG capsule Take 1 capsule (500 mg total) by mouth 4 (four) times daily. 10/10/17   Eyvonne Mechanic, PA-C    Family History Family History  Problem Relation Age of Onset  . Hypertension Maternal Grandmother   . Thyroid disease Maternal Grandmother   . Hypertension Mother     Social History Social History   Tobacco Use  . Smoking status: Never Smoker  . Smokeless tobacco: Never Used  Substance Use Topics  . Alcohol use: No  . Drug use: No     Allergies   Patient has no known allergies.   Review of Systems Review of Systems  All other systems reviewed and are negative.    Physical Exam Updated Vital  Signs BP 109/67   Pulse 66   Temp 98.3 F (36.8 C) (Oral)   Resp 16   Ht 5\' 4"  (1.626 m)   Wt 77.1 kg (170 lb)   LMP 10/07/2017   SpO2 100%   BMI 29.18 kg/m   Physical Exam  Constitutional: She is oriented to person, place, and time. She appears well-developed and well-nourished.  HENT:  Head: Normocephalic and atraumatic.  Eyes: Conjunctivae are normal. Pupils are equal, round, and reactive to light. Right eye exhibits no discharge. Left eye exhibits no discharge. No scleral icterus.  Neck: Normal range of motion. No JVD present. No tracheal deviation present.  Pulmonary/Chest: Effort normal. No stridor.  Neurological: She is alert and oriented to person, place, and time. Coordination normal.  Skin:  .75 cm lesion with central depression and redness- minor surrounding redness noted   Psychiatric: She has a normal mood and affect. Her behavior is normal. Judgment and thought content normal.  Nursing note and vitals reviewed.    ED Treatments / Results  Labs (all labs ordered are listed, but only abnormal results are displayed) Labs Reviewed - No data to display  EKG  EKG Interpretation None       Radiology No results found.  Procedures Procedures (including critical care  time)  Medications Ordered in ED Medications - No data to display   Initial Impression / Assessment and Plan / ED Course  I have reviewed the triage vital signs and the nursing notes.  Pertinent labs & imaging results that were available during my care of the patient were reviewed by me and considered in my medical decision making (see chart for details).     Final Clinical Impressions(s) / ED Diagnoses   Final diagnoses:  Facial lesion   Labs:   Imaging:  Consults:  Therapeutics:  Discharge Meds: keflex   Assessment/Plan: 27 year old female presents today with lesion to her nose.  Presentation is characteristic of basal cell carcinoma.  Patient does have some very minor  surrounding redness.  I have lower suspicion for acute infectious etiology on this patient.  She will be started on a short course of antibiotics.  Discussed the need for very close follow-up with dermatology.  Patient is instructed to make contact with dermatologist as soon as possible to schedule follow-up with potential biopsy.  Patient given strict return precautions, she verbalized understanding and agreement to today's plan had no further questions or concerns at time of discharge.      ED Discharge Orders        Ordered    cephALEXin (KEFLEX) 500 MG capsule  4 times daily     10/10/17 1951       Eyvonne MechanicHedges, Lorieann Argueta, Cordelia Poche-C 10/10/17 1952    Terrilee FilesButler, Michael C, MD 10/12/17 1144

## 2018-02-05 ENCOUNTER — Encounter (HOSPITAL_BASED_OUTPATIENT_CLINIC_OR_DEPARTMENT_OTHER): Payer: Self-pay | Admitting: *Deleted

## 2018-02-05 ENCOUNTER — Emergency Department (HOSPITAL_BASED_OUTPATIENT_CLINIC_OR_DEPARTMENT_OTHER)
Admission: EM | Admit: 2018-02-05 | Discharge: 2018-02-06 | Disposition: A | Discharge status: Home / Self Care | Payer: Medicaid Other | Attending: Emergency Medicine | Admitting: Emergency Medicine

## 2018-02-05 ENCOUNTER — Other Ambulatory Visit: Payer: Self-pay

## 2018-02-05 DIAGNOSIS — R519 Headache, unspecified: Secondary | ICD-10-CM

## 2018-02-05 DIAGNOSIS — H02841 Edema of right upper eyelid: Secondary | ICD-10-CM | POA: Insufficient documentation

## 2018-02-05 DIAGNOSIS — R51 Headache: Secondary | ICD-10-CM | POA: Insufficient documentation

## 2018-02-05 DIAGNOSIS — H029 Unspecified disorder of eyelid: Secondary | ICD-10-CM

## 2018-02-05 NOTE — Discharge Instructions (Addendum)
Continue ibuprofen 600 mg every 6 hours as needed for pain.  Apply warm compresses to your eye several times daily for the next few days.  Return to the emergency department if symptoms significantly worsen or change.
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
## 2018-08-30 ENCOUNTER — Other Ambulatory Visit: Payer: Self-pay

## 2018-08-30 ENCOUNTER — Encounter (HOSPITAL_BASED_OUTPATIENT_CLINIC_OR_DEPARTMENT_OTHER): Payer: Self-pay | Admitting: *Deleted

## 2018-08-30 ENCOUNTER — Emergency Department (HOSPITAL_BASED_OUTPATIENT_CLINIC_OR_DEPARTMENT_OTHER)
Admission: EM | Admit: 2018-08-30 | Discharge: 2018-08-30 | Disposition: A | Discharge status: Home / Self Care | Payer: Self-pay | Attending: Emergency Medicine | Admitting: Emergency Medicine

## 2018-08-30 DIAGNOSIS — R69 Illness, unspecified: Secondary | ICD-10-CM

## 2018-08-30 DIAGNOSIS — J111 Influenza due to unidentified influenza virus with other respiratory manifestations: Secondary | ICD-10-CM

## 2018-08-30 MED ORDER — ONDANSETRON 4 MG PO TBDP: 4.0000 mg | ORAL_TABLET | Freq: Three times a day (TID) | ORAL | 0 refills | Status: AC | PRN

## 2018-08-30 MED ORDER — ONDANSETRON 4 MG PO TBDP
4.0000 mg | ORAL_TABLET | Freq: Once | ORAL | Status: AC
  Administered 2018-08-30: 4 mg via ORAL
  Filled 2018-08-30: qty 1

## 2018-08-30 MED ORDER — KETOROLAC TROMETHAMINE 30 MG/ML IJ SOLN
30.0000 mg | Freq: Once | INTRAMUSCULAR | Status: AC
  Administered 2018-08-30: 30 mg via INTRAVENOUS
  Filled 2018-08-30: qty 1

## 2018-08-30 MED ORDER — OXYMETAZOLINE HCL 0.05 % NA SOLN
1.0000 | Freq: Once | NASAL | Status: AC
  Administered 2018-08-30: 1 via NASAL
  Filled 2018-08-30: qty 15

## 2018-08-30 NOTE — Discharge Instructions (Addendum)
I have prescribed Afrin for the nasal congestion do not use longer than 3 days. Use Motrin or tylenol for fever and muscle aches. Make sure you have plenty of fluid and rest.I also prescribed Zofran and antinausea medication to use as needed. If symptoms do not improve in the next few days follow up with your primary care provider.

## 2018-08-30 NOTE — ED Triage Notes (Signed)
Pt had a fever yesterday of 100.7, she has had generalized body aches as well.

## 2018-08-30 NOTE — ED Provider Notes (Signed)
[facility] EMERGENCY DEPARTMENT Provider Note   CSN: [id number] Arrival date & time: 08/30/18  0755     History   Chief Complaint Chief Complaint  Patient presents with  . Fever    HPI Brooke Howard is a 28 y.o. female with no significant pas medical history who present today complaining of fever, cough, congestion, headaches chest pain and myalgias for the past two days. Patient also reports one episode of NBNB emesis this morning. She has denies any wheezing, abdominal pain, diarrhea. Patient reports daughter was sick with the flu last week. She denies any other sick contact. She has only taken tylenol for her fever once but no other OTC flu or cold medication.   HPI  Past Medical History:  Diagnosis Date  . Acute blood loss anemia 01/07/2015  . Infection    kidney infection  . postpartum depression     Patient Active Problem List   Diagnosis Date Noted  . Gestational diabetes 01/31/2017  . GDM, class A1 01/31/2017  . Supervision of high risk pregnancy, antepartum 01/09/2017  . Gestational diabetes mellitus 01/09/2017    Past Surgical History:  Procedure Laterality Date  . MOLE REMOVAL       OB History    Gravida  2   Para  2   Term  2   Preterm  0   AB  0   Living  2     SAB  0   TAB  0   Ectopic  0   Multiple  0   Live Births  2            Home Medications    Prior to Admission medications   Medication Sig Start Date End Date Taking? Authorizing Provider  cephALEXin (KEFLEX) 500 MG capsule Take 1 capsule (500 mg total) by mouth 4 (four) times daily. 10/10/17   Hedges, Tinnie Gens, PA-C  ondansetron (ZOFRAN ODT) 4 MG disintegrating tablet Take 1 tablet (4 mg total) by mouth every 8 (eight) hours as needed for nausea or vomiting. 08/30/18   Lovena Neighbours, MD    Family History Family History  Problem Relation Age of Onset  . Hypertension Maternal Grandmother   . Thyroid disease Maternal Grandmother   . Hypertension Mother      Social History Social History   Tobacco Use  . Smoking status: Never Smoker  . Smokeless tobacco: Never Used  Substance Use Topics  . Alcohol use: No  . Drug use: No     Allergies   Patient has no known allergies.   Review of Systems Review of Systems  Constitutional: Positive for fatigue.  HENT: Positive for congestion, postnasal drip and rhinorrhea.   Respiratory: Positive for cough.   Cardiovascular: Positive for chest pain.  Gastrointestinal: Positive for nausea.  Genitourinary: Negative.   Musculoskeletal: Positive for myalgias.  Neurological: Positive for headaches.     Physical Exam Updated Vital Signs BP 109/61 (BP Location: Right Arm)   Pulse (!) 104   Temp 100.1 F (37.8 C) (Oral)   Resp 16   Ht 5\' 6"  (1.676 m)   Wt 77.1 kg   SpO2 95%   BMI 27.44 kg/m   Physical Exam Constitutional:      Appearance: She is normal weight.  HENT:     Head: Normocephalic and atraumatic.     Right Ear: Tympanic membrane normal.     Left Ear: Tympanic membrane normal.     Nose: Nose normal.  Mouth/Throat:     Mouth: Mucous membranes are moist.  Eyes:     Pupils: Pupils are equal, round, and reactive to light.  Neck:     Musculoskeletal: Normal range of motion.  Cardiovascular:     Rate and Rhythm: Normal rate.     Pulses: Normal pulses.  Pulmonary:     Effort: Pulmonary effort is normal.  Abdominal:     General: Abdomen is flat. Bowel sounds are normal.  Musculoskeletal: Normal range of motion.     Comments: Tender to palpation around sternum   Skin:    General: Skin is warm and dry.     Capillary Refill: Capillary refill takes less than 2 seconds.  Neurological:     General: No focal deficit present.     Mental Status: She is alert.  Psychiatric:        Mood and Affect: Mood normal.      ED Treatments / Results  Labs (all labs ordered are listed, but only abnormal results are displayed) Labs Reviewed - No data to  display  EKG None  Radiology No results found.  Procedures Procedures (including critical care time)  Medications Ordered in ED Medications  ondansetron (ZOFRAN-ODT) disintegrating tablet 4 mg (4 mg Oral Given 08/30/18 0839)  ketorolac (TORADOL) 30 MG/ML injection 30 mg (30 mg Intravenous Given 08/30/18 0839)  oxymetazoline (AFRIN) 0.05 % nasal spray 1 spray (1 spray Each Nare Given 08/30/18 0913)     Initial Impression / Assessment and Plan / ED Course  I have reviewed the triage vital signs and the nursing notes.  Pertinent labs & imaging results that were available during my care of the patient were reviewed by me and considered in my medical decision making (see chart for details).   Patient is a 28 yo female presenting with cough, congestion, fever, myalgias, and emesis - chest pain and headaches for the past 2 days in the setting of sick contact. Vitals on admission were within normal limits except for mild tachycardia. Exam was remarkable for chest tenderness on palpation likely to be MSK in etiology given persistent cough for the past 2 day Symptoms on presentation, and exam findings most consistent with a likely diagnosis of flu. Given appearance and chronicity will treat symptomatically. Patient given toradol 30 mg IM and zofran. Will send home with Afrin for congestion and NSAIDs for pain fever and myalgias. Will additionally recommend hydration and rest. Patient will follow up with PCP as needed if symptoms do not improve or worsens.  Final Clinical Impressions(s) / ED Diagnoses   Final diagnoses:  Influenza-like illness    ED Discharge Orders         Ordered    ondansetron (ZOFRAN ODT) 4 MG disintegrating tablet  Every 8 hours PRN     08/30/18 0900           Lovena Neighbours, MD 08/30/18 3546    Jacalyn Lefevre, MD 08/30/18 1003

## 2019-06-28 ENCOUNTER — Emergency Department (HOSPITAL_BASED_OUTPATIENT_CLINIC_OR_DEPARTMENT_OTHER)
Admission: EM | Admit: 2019-06-28 | Discharge: 2019-06-28 | Disposition: A | Discharge status: Home / Self Care | Payer: BC Managed Care – PPO | Attending: Emergency Medicine | Admitting: Emergency Medicine

## 2019-06-28 ENCOUNTER — Emergency Department (HOSPITAL_BASED_OUTPATIENT_CLINIC_OR_DEPARTMENT_OTHER): Payer: BC Managed Care – PPO

## 2019-06-28 ENCOUNTER — Encounter (HOSPITAL_BASED_OUTPATIENT_CLINIC_OR_DEPARTMENT_OTHER): Payer: Self-pay

## 2019-06-28 ENCOUNTER — Other Ambulatory Visit: Payer: Self-pay

## 2019-06-28 DIAGNOSIS — R42 Dizziness and giddiness: Secondary | ICD-10-CM | POA: Diagnosis not present

## 2019-06-28 DIAGNOSIS — R079 Chest pain, unspecified: Secondary | ICD-10-CM | POA: Diagnosis not present

## 2019-06-28 DIAGNOSIS — R519 Headache, unspecified: Secondary | ICD-10-CM | POA: Diagnosis not present

## 2019-06-28 DIAGNOSIS — R0789 Other chest pain: Secondary | ICD-10-CM

## 2019-06-28 LAB — CBC WITH DIFFERENTIAL/PLATELET
Abs Immature Granulocytes: 0.01 10*3/uL (ref 0.00–0.07)
Basophils Absolute: 0 10*3/uL (ref 0.0–0.1)
Basophils Relative: 0 %
Eosinophils Absolute: 0.1 10*3/uL (ref 0.0–0.5)
Eosinophils Relative: 1 %
HCT: 40.2 % (ref 36.0–46.0)
Hemoglobin: 13.4 g/dL (ref 12.0–15.0)
Immature Granulocytes: 0 %
Lymphocytes Relative: 43 %
Lymphs Abs: 2.3 10*3/uL (ref 0.7–4.0)
MCH: 30.5 pg (ref 26.0–34.0)
MCHC: 33.3 g/dL (ref 30.0–36.0)
MCV: 91.6 fL (ref 80.0–100.0)
Monocytes Absolute: 0.5 10*3/uL (ref 0.1–1.0)
Monocytes Relative: 9 %
Neutro Abs: 2.4 10*3/uL (ref 1.7–7.7)
Neutrophils Relative %: 47 %
Platelets: 218 10*3/uL (ref 150–400)
RBC: 4.39 MIL/uL (ref 3.87–5.11)
RDW: 13.1 % (ref 11.5–15.5)
WBC: 5.2 10*3/uL (ref 4.0–10.5)
nRBC: 0 % (ref 0.0–0.2)

## 2019-06-28 LAB — BASIC METABOLIC PANEL
Anion gap: 10 (ref 5–15)
BUN: 11 mg/dL (ref 6–20)
CO2: 24 mmol/L (ref 22–32)
Calcium: 9 mg/dL (ref 8.9–10.3)
Chloride: 103 mmol/L (ref 98–111)
Creatinine, Ser: 0.61 mg/dL (ref 0.44–1.00)
GFR calc Af Amer: >60 mL/min (ref >60)
GFR calc non Af Amer: >60 mL/min (ref >60)
Glucose, Bld: 85 mg/dL (ref 70–99)
Potassium: 3.5 mmol/L (ref 3.5–5.1)
Sodium: 137 mmol/L (ref 135–145)

## 2019-06-28 LAB — PREGNANCY, URINE: Preg Test, Ur: NEGATIVE

## 2019-06-28 LAB — TROPONIN I (HIGH SENSITIVITY): Troponin I (High Sensitivity): <2 ng/L (ref <18)

## 2019-06-28 MED ORDER — PROCHLORPERAZINE EDISYLATE 10 MG/2ML IJ SOLN
10.0000 mg | Freq: Once | INTRAMUSCULAR | Status: AC
  Administered 2019-06-28: 13:00:00 10 mg via INTRAVENOUS
  Filled 2019-06-28: qty 2

## 2019-06-28 MED ORDER — SODIUM CHLORIDE 0.9 % IV BOLUS
1000.0000 mL | Freq: Once | INTRAVENOUS | Status: AC
  Administered 2019-06-28: 1000 mL via INTRAVENOUS

## 2019-06-28 MED ORDER — DIPHENHYDRAMINE HCL 50 MG/ML IJ SOLN
25.0000 mg | Freq: Once | INTRAMUSCULAR | Status: AC
  Administered 2019-06-28: 25 mg via INTRAVENOUS
  Filled 2019-06-28: qty 1

## 2019-06-28 NOTE — ED Notes (Signed)
X-ray at bedside

## 2019-06-28 NOTE — ED Triage Notes (Signed)
Pt c/o HA x 3 weeks-dizzy x 3 days-CP x today-denies fever/flu like sx-NAD-steady gait

## 2019-06-28 NOTE — ED Provider Notes (Signed)
[facility]ERGENCY DEPARTMENT Provider Note   CSN: [id number] Arrival date & time: 06/28/19  1138     History   Chief Complaint Chief Complaint  Patient presents with  . Headache  . Chest Pain    HPI Brooke Howard is a 28 y.o. female.     28 year old female with past medical history below who presents with headache, dizziness, and chest pain.  Patient reports that she has had 3 weeks of persistent headache that was gradual in onset but has been constant.  She has been taking Tylenol and ibuprofen without relief.  She does admit to taking ibuprofen daily.  She notes that for the past few days, she has had dizziness which she describes as an off-balance sensation.  It is worse when she moves her head.  It can occur while she is sitting or standing.  No associated tinnitus, visual changes, speech problems, focal weakness/numbness, fevers, vomiting, or photophobia.  Patient also reports that yesterday she began having central, nonradiating, constant chest pressure that has continued throughout yesterday into today.  No associated shortness of breath.  Nothing makes the pain better or worse.  No recent travel, OCP use, history of blood clots, or leg swelling.  The history is provided by the patient.  Headache Chest Pain Associated symptoms: headache     Past Medical History:  Diagnosis Date  . Acute blood loss anemia 01/07/2015  . Infection    kidney infection  . postpartum depression     Patient Active Problem List   Diagnosis Date Noted  . Gestational diabetes 01/31/2017  . GDM, class A1 01/31/2017  . Supervision of high risk pregnancy, antepartum 01/09/2017  . Gestational diabetes mellitus 01/09/2017    Past Surgical History:  Procedure Laterality Date  . MOLE REMOVAL       OB History    Gravida  2   Para  2   Term  2   Preterm  0   AB  0   Living  2     SAB  0   TAB  0   Ectopic  0   Multiple  0   Live Births  2            Home  Medications    Prior to Admission medications   Medication Sig Start Date End Date Taking? Authorizing Provider  cephALEXin (KEFLEX) 500 MG capsule Take 1 capsule (500 mg total) by mouth 4 (four) times daily. 10/10/17   Hedges, Dellis Filbert, PA-C  ondansetron (ZOFRAN ODT) 4 MG disintegrating tablet Take 1 tablet (4 mg total) by mouth every 8 (eight) hours as needed for nausea or vomiting. 08/30/18   Marjie Skiff, MD    Family History Family History  Problem Relation Age of Onset  . Hypertension Maternal Grandmother   . Thyroid disease Maternal Grandmother   . Hypertension Mother     Social History Social History   Tobacco Use  . Smoking status: Never Smoker  . Smokeless tobacco: Never Used  Substance Use Topics  . Alcohol use: No  . Drug use: No     Allergies   Patient has no known allergies.   Review of Systems Review of Systems  Cardiovascular: Positive for chest pain.  Neurological: Positive for headaches.   All other systems reviewed and are negative except that which was mentioned in HPI   Physical Exam Updated Vital Signs BP 103/61   Pulse 60   Temp 98 F (36.7 C) (Oral)  Resp 15   Ht 5\' 5"  (1.651 m)   Wt 78.5 kg   SpO2 100%   BMI 28.79 kg/m   Physical Exam Vitals signs and nursing note reviewed.  Constitutional:      General: She is not in acute distress.    Appearance: She is well-developed.     Comments: Awake, alert  HENT:     Head: Normocephalic and atraumatic.  Eyes:     Extraocular Movements: Extraocular movements intact.     Conjunctiva/sclera: Conjunctivae normal.     Pupils: Pupils are equal, round, and reactive to light.  Neck:     Musculoskeletal: Neck supple.  Cardiovascular:     Rate and Rhythm: Normal rate and regular rhythm.     Heart sounds: Normal heart sounds. No murmur.  Pulmonary:     Effort: Pulmonary effort is normal. No respiratory distress.     Breath sounds: Normal breath sounds.  Abdominal:     General: Bowel  sounds are normal. There is no distension.     Palpations: Abdomen is soft.     Tenderness: There is no abdominal tenderness.  Skin:    General: Skin is warm and dry.  Neurological:     Mental Status: She is alert and oriented to person, place, and time.     Cranial Nerves: No cranial nerve deficit.     Motor: No abnormal muscle tone.     Deep Tendon Reflexes: Reflexes are normal and symmetric.     Comments: Fluent speech, normal finger-to-nose testing, negative pronator drift, no clonus 5/5 strength and normal sensation x all 4 extremities  Psychiatric:        Thought Content: Thought content normal.        Judgment: Judgment normal.      ED Treatments / Results  Labs (all labs ordered are listed, but only abnormal results are displayed) Labs Reviewed  PREGNANCY, URINE  BASIC METABOLIC PANEL  CBC WITH DIFFERENTIAL/PLATELET  TROPONIN I (HIGH SENSITIVITY)    EKG EKG Interpretation  Date/Time:  Friday June 28 2019 11:57:07 EST Ventricular Rate:  70 PR Interval:    QRS Duration: 94 QT Interval:  363 QTC Calculation: 392 R Axis:   60 Text Interpretation: Sinus rhythm Nonspecific T abnormalities, anterior leads Baseline wander in lead(s) V6 No previous ECGs available Confirmed by [id number] [phone]) on 06/28/2019 12:03:23 PM   Radiology Dg Chest Port 1 View  Result Date: 06/28/2019 CLINICAL DATA:  Central chest pain EXAM: PORTABLE CHEST 1 VIEW COMPARISON:  December 18, 2016 FINDINGS: The heart size and mediastinal contours are within normal limits. Both lungs are clear. The visualized skeletal structures are unremarkable. IMPRESSION: No acute process in the chest. Electronically Signed   By: Dec 20, 2016 M.D.   On: 06/28/2019 14:03    Procedures Procedures (including critical care time)  Medications Ordered in ED Medications  diphenhydrAMINE (BENADRYL) injection 25 mg (25 mg Intravenous Given 06/28/19 1319)  prochlorperazine (COMPAZINE) injection 10 mg (10 mg  Intravenous Given 06/28/19 1319)  sodium chloride 0.9 % bolus 1,000 mL (0 mLs Intravenous Stopped 06/28/19 1431)     Initial Impression / Assessment and Plan / ED Course  I have reviewed the triage vital signs and the nursing notes.  Pertinent labs & imaging results that were available during my care of the patient were reviewed by me and considered in my medical decision making (see chart for details).       Well-appearing on exam, normal vital signs, normal neurologic  exam.  No sudden onset of severe headache or any concerning neurologic features to suggest intracranial process.  I suspect she may be having analgesic rebound headaches given daily NSAID and Tylenol use.  Gave fluid bolus and migraine cocktail and on reassessment she reported much improvement in her headache.  Discussed supportive measures at home and recommended discontinuation of OTC medications in the event that she is having rebound headaches.  Regarding her chest pain, symptoms are very atypical for ACS.  Single troponin is normal and I feel this is sufficient given that chest pain symptoms have been constant since yesterday.  Chest x-ray clear.  Patient is PERC negative therefore PE very unlikely.  EKG reassuring.  I discussed follow-up with PCP and provided with local clinic numbers as she requested information to establish care.  I have extensively reviewed return precautions and she voiced understanding.  Final Clinical Impressions(s) / ED Diagnoses   Final diagnoses:  Nonintractable headache, unspecified chronicity pattern, unspecified headache type  Atypical chest pain  Dizziness    ED Discharge Orders    None       Little, Ambrose Finlandachel Morgan, MD 06/28/19 1521

## 2019-06-28 NOTE — ED Notes (Signed)
Pt on monitor 

## 2019-06-28 NOTE — ED Notes (Signed)
States when she turns her head from side to side, it makes her headache worse and causes her to be dizzy, denies n/v

## 2019-06-28 NOTE — ED Notes (Signed)
ED Provider at bedside. 

## 2021-04-27 ENCOUNTER — Other Ambulatory Visit (HOSPITAL_COMMUNITY)
Admission: RE | Admit: 2021-04-27 | Discharge: 2021-04-27 | Disposition: A | Discharge status: Home / Self Care | Payer: BC Managed Care – PPO | Source: Ambulatory Visit | Attending: Family Medicine | Admitting: Family Medicine

## 2021-04-27 ENCOUNTER — Other Ambulatory Visit: Payer: Self-pay

## 2021-04-27 ENCOUNTER — Ambulatory Visit: Payer: Self-pay | Admitting: *Deleted

## 2021-04-27 ENCOUNTER — Other Ambulatory Visit (HOSPITAL_COMMUNITY)
Admission: RE | Admit: 2021-04-27 | Discharge: 2021-04-27 | Disposition: A | Discharge status: Home / Self Care | Payer: Self-pay | Source: Ambulatory Visit | Attending: Family Medicine | Admitting: Family Medicine

## 2021-04-27 ENCOUNTER — Ambulatory Visit (INDEPENDENT_AMBULATORY_CARE_PROVIDER_SITE_OTHER): Payer: Self-pay | Admitting: Family Medicine

## 2021-04-27 ENCOUNTER — Ambulatory Visit: Payer: BC Managed Care – PPO

## 2021-04-27 VITALS — Wt 182.4 lb

## 2021-04-27 VITALS — BP 102/74 | Wt 182.7 lb

## 2021-04-27 DIAGNOSIS — R87810 Cervical high risk human papillomavirus (HPV) DNA test positive: Secondary | ICD-10-CM

## 2021-04-27 DIAGNOSIS — R87610 Atypical squamous cells of undetermined significance on cytologic smear of cervix (ASC-US): Secondary | ICD-10-CM

## 2021-04-27 DIAGNOSIS — Z1239 Encounter for other screening for malignant neoplasm of breast: Secondary | ICD-10-CM

## 2021-04-27 DIAGNOSIS — Z3202 Encounter for pregnancy test, result negative: Secondary | ICD-10-CM

## 2021-04-27 LAB — POCT URINALYSIS DIP (DEVICE)
Bilirubin Urine: NEGATIVE
Glucose, UA: NEGATIVE mg/dL
Ketones, ur: NEGATIVE mg/dL
Nitrite: NEGATIVE
Protein, ur: NEGATIVE mg/dL
Specific Gravity, Urine: 1.025 (ref 1.005–1.030)
Urobilinogen, UA: 1 mg/dL (ref 0.0–1.0)
pH: 7 (ref 5.0–8.0)

## 2021-04-27 NOTE — Patient Instructions (Signed)
Explained breast self awareness with Brooke Howard. Patient did not need a Pap smear today due to last Pap smear was 02/12/2021. Explained the colposcopy the recommended follow-up for her abnormal Pap smear. Referred patient to the [facility] for [facility] for a colposcopy to follow-up for her abnormal Appointment scheduled Tuesday, April 27, 2021 at 0935. Patient aware of appointment and will be there. Let patient know a screening mammogram is recommended at age 89 unless clinically indicated prior. Brooke Howard verbalized understanding.  Caleb Decock, Kathaleen Maser, RN 8:52 AM

## 2021-04-27 NOTE — Progress Notes (Signed)
Ms. Brooke Howard is a 30 y.o. female who presents to [facility] clinic today with no complaints. Patient referred to [facility] by the [facility] due to having an abnormal Pap smear 02/12/2021 and a colposcopy is recommended for follow-up.   Pap Smear: Pap smear not completed today. Last Pap smear was 02/12/2021 at [facility] clinic and was abnormal - ASCUS with positive HPV . Per patient has no history of an abnormal Pap smear prior to her most recent Pap smear. Last Pap smear result is available in Epic.   Physical exam: Breasts Breasts symmetrical. No skin abnormalities bilateral breasts. No nipple retraction bilateral breasts. No nipple discharge bilateral breasts. No lymphadenopathy. No lumps palpated bilateral breasts. No complaints of pain or tenderness on exam. Screening mammogram recommended at age 69 unless clinically indicated prior.       Pelvic/Bimanual Pap is not indicated today per [facility]CP guidelines.   Smoking History: Patient has never smoked.   Patient Navigation: Patient education provided. Access to services provided for patient through [facility]CP program.    Breast and Cervical Cancer Risk Assessment: Patient does not have family history of breast cancer, known genetic mutations, or radiation treatment to the chest before age 30. Patient does not have history of cervical dysplasia, immunocompromised, or DES exposure in-utero. Breast cancer risk assessment completed. No breast cancer risk calculated due to patient is less than 36 years old.  Risk Assessment     Risk Scores       04/27/2021   Last edited by: Meryl Dare, CMA   5-year risk:    Lifetime risk:             A: [facility]CP exam without pap smear No complaints.  P: Referred patient to the [facility] for a colposcopy to follow-up for her abnormal Appointment scheduled Tuesday, April 27, 2021 at 0935.  Priscille Heidelberg, RN 04/27/2021  8:52 AM

## 2021-04-27 NOTE — Progress Notes (Signed)
    GYNECOLOGY CLINIC COLPOSCOPY PROCEDURE NOTE  30 y.o. [id number] here for colposcopy for pap finding of:  PAP: ASCUS, HPV+  Discussed role for HPV in cervical dysplasia, need for surveillance, nature of the procedure, and risks and benefits.  Pregnancy test: Lab Results  Component Value Date   PREGTESTUR NEGATIVE 06/28/2019    No Known Allergies  Patient given informed consent, signed copy in the chart, time out was performed.    Placed in lithotomy position. Cervix viewed with speculum and colposcope after application of acetic acid.   Colposcopy Adequacy Cervix fully visualized: Yes  SCJ fully visualized: Yes  Colposcopy Findings faint acetowhite lesion(s) noted at 12 o'clock  Corresponding biopsies were obtained.    ECC specimen was obtained.  All specimens were labeled and sent to pathology.  Hemostatic measures: Pressure and Monsel's solution  Complications: none  Patient tolerated the procedure well.  Physical Exam Genitourinary:     Genitourinary Comments: Cervix visualized on speculum exam. IUD strings visualized. Faint white lesions noted at 12 o'clock region of cervix.   Cardiovascular:     Rate and Rhythm: Normal rate.     Pulses: Normal pulses.  Neurological:     Mental Status: She is alert.  Vitals and nursing note reviewed. Exam conducted with a chaperone present.    Colposcopy Impressions Low grade features  Plan Treatment plan pending biopsy results, per patient preference they will be communicated by Mychart.  Patient was given post procedure instructions.  Will follow up pathology and manage accordingly; patient will be contacted with results and recommendations.  Routine preventative health maintenance measures emphasized.  Warner Mccreedy, MD/MPH OB Fellow Family Medicine Physician, [facility] for [facility], [facility]

## 2021-04-29 LAB — CERVICOVAGINAL ANCILLARY ONLY
Bacterial Vaginitis (gardnerella): NEGATIVE
Candida Glabrata: NEGATIVE
Candida Vaginitis: NEGATIVE
Chlamydia: NEGATIVE
Comment: NEGATIVE
Comment: NEGATIVE
Comment: NEGATIVE
Comment: NEGATIVE
Comment: NEGATIVE
Comment: NORMAL
Neisseria Gonorrhea: NEGATIVE
Trichomonas: NEGATIVE

## 2021-04-29 LAB — POCT PREGNANCY, URINE: Preg Test, Ur: NEGATIVE

## 2021-04-29 LAB — SURGICAL PATHOLOGY

## 2021-05-22 ENCOUNTER — Encounter: Payer: Self-pay | Admitting: Radiology

## 2021-06-14 ENCOUNTER — Encounter (HOSPITAL_BASED_OUTPATIENT_CLINIC_OR_DEPARTMENT_OTHER): Payer: Self-pay

## 2021-06-14 ENCOUNTER — Emergency Department (HOSPITAL_BASED_OUTPATIENT_CLINIC_OR_DEPARTMENT_OTHER): Payer: Self-pay

## 2021-06-14 ENCOUNTER — Emergency Department (HOSPITAL_BASED_OUTPATIENT_CLINIC_OR_DEPARTMENT_OTHER)
Admission: EM | Admit: 2021-06-14 | Discharge: 2021-06-14 | Disposition: A | Discharge status: Home / Self Care | Payer: Self-pay | Attending: Emergency Medicine | Admitting: Emergency Medicine

## 2021-06-14 ENCOUNTER — Other Ambulatory Visit: Payer: Self-pay

## 2021-06-14 DIAGNOSIS — R079 Chest pain, unspecified: Secondary | ICD-10-CM

## 2021-06-14 DIAGNOSIS — R200 Anesthesia of skin: Secondary | ICD-10-CM | POA: Insufficient documentation

## 2021-06-14 DIAGNOSIS — R519 Headache, unspecified: Secondary | ICD-10-CM | POA: Insufficient documentation

## 2021-06-14 DIAGNOSIS — R42 Dizziness and giddiness: Secondary | ICD-10-CM | POA: Insufficient documentation

## 2021-06-14 LAB — BASIC METABOLIC PANEL
Anion gap: 7 (ref 5–15)
BUN: 15 mg/dL (ref 6–20)
CO2: 26 mmol/L (ref 22–32)
Calcium: 9.3 mg/dL (ref 8.9–10.3)
Chloride: 104 mmol/L (ref 98–111)
Creatinine, Ser: 0.61 mg/dL (ref 0.44–1.00)
GFR, Estimated: >60 mL/min (ref >60)
Glucose, Bld: 90 mg/dL (ref 70–99)
Potassium: 3.8 mmol/L (ref 3.5–5.1)
Sodium: 137 mmol/L (ref 135–145)

## 2021-06-14 LAB — TROPONIN I (HIGH SENSITIVITY)
Troponin I (High Sensitivity): <2 ng/L (ref <18)
Troponin I (High Sensitivity): <2 ng/L (ref <18)

## 2021-06-14 LAB — PREGNANCY, URINE: Preg Test, Ur: NEGATIVE

## 2021-06-14 LAB — CBC
HCT: 38.8 % (ref 36.0–46.0)
Hemoglobin: 13.3 g/dL (ref 12.0–15.0)
MCH: 30.8 pg (ref 26.0–34.0)
MCHC: 34.3 g/dL (ref 30.0–36.0)
MCV: 89.8 fL (ref 80.0–100.0)
Platelets: 267 10*3/uL (ref 150–400)
RBC: 4.32 MIL/uL (ref 3.87–5.11)
RDW: 12.8 % (ref 11.5–15.5)
WBC: 7.7 10*3/uL (ref 4.0–10.5)
nRBC: 0 % (ref 0.0–0.2)

## 2021-06-14 NOTE — ED Notes (Signed)
Pt with HIHSS 0 at this time. Had episode of facial numbness on right side for one minute this am.  No numbness at this time.  Pt reports a HA and some dizziness which increases with rapid movement of head.  Pt is alert and oriented.

## 2021-06-14 NOTE — ED Triage Notes (Signed)
Pt states was driving to school this morning at approximately 0830 when she felt the right side of her face go numb. Denies numbness at time of triage, states lasted 1 minute. C/o headache x 3 weeks. Chest pain and dizziness at time of triage. Stroke screen negative at time of triage. Spanish interpreter used during triage.

## 2021-06-14 NOTE — Discharge Instructions (Signed)
Please return if you begin to have worsening chest pain, new numbness or tingling of the face especially if it does not resolve immediately.  Please follow-up with your primary care doctor for further evaluation and continued chest pain.

## 2021-06-14 NOTE — ED Provider Notes (Signed)
[facility] EMERGENCY DEPARTMENT Provider Note   CSN: [id number] Arrival date & time: 06/14/21  2841     History Chief Complaint  Patient presents with   Headache   Chest Pain    This interview was conducted with the use of a video interpreter service.  Yaa Burnsworth is a 30 y.o. female with no significant PMH who presents after an incident this morning in which she had 1 minute of right sided facial numbness, followed by some dizziness and chest pain. Patient denies history of diabetes, cigarette smoking, personal or family history of CAD, ACS. Patient reports the chest pain is centrally located, sharp in nature, without radiation, no nausea, no vomiting, no lightheadedness, no loss of consciousness. Patient reports the chest pain started at 8:30am. At this time patient's numbness has entirely resolved, she has not had any dysarthria, confusion, weakness, facial droop. Patient does not take any medications. At the time of my examination patient does not endorse headache although she initially endorses HA during triage.   Headache Associated symptoms: dizziness   Chest Pain Associated symptoms: dizziness and headache       Past Medical History:  Diagnosis Date   Acute blood loss anemia 01/07/2015   Infection    kidney infection   postpartum depression     Patient Active Problem List   Diagnosis Date Noted   Gestational diabetes 01/31/2017   GDM, class A1 01/31/2017   Supervision of high risk pregnancy, antepartum 01/09/2017   Gestational diabetes mellitus 01/09/2017    Past Surgical History:  Procedure Laterality Date   MOLE REMOVAL       OB History     Gravida  2   Para  2   Term  2   Preterm  0   AB  0   Living  2      SAB  0   IAB  0   Ectopic  0   Multiple  0   Live Births  2           Family History  Problem Relation Age of Onset   Hypertension Maternal Grandmother    Thyroid disease Maternal Grandmother    Hypertension  Mother     Social History   Tobacco Use   Smoking status: Never   Smokeless tobacco: Never  Vaping Use   Vaping Use: Never used  Substance Use Topics   Alcohol use: No   Drug use: No    Home Medications Prior to Admission medications   Medication Sig Start Date End Date Taking? Authorizing Provider  cephALEXin (KEFLEX) 500 MG capsule Take 1 capsule (500 mg total) by mouth 4 (four) times daily. Patient not taking: Reported on 04/27/2021 10/10/17   Hedges, Tinnie Gens, PA-C  ondansetron (ZOFRAN ODT) 4 MG disintegrating tablet Take 1 tablet (4 mg total) by mouth every 8 (eight) hours as needed for nausea or vomiting. Patient not taking: Reported on 04/27/2021 08/30/18   Lovena Neighbours, MD    Allergies    Patient has no known allergies.  Review of Systems   Review of Systems  Cardiovascular:  Positive for chest pain.  Neurological:  Positive for dizziness and headaches.  All other systems reviewed and are negative.  Physical Exam Updated Vital Signs BP 105/69 (BP Location: Right Arm)   Pulse 71   Temp 98.2 F (36.8 C) (Oral)   Resp 18   Ht 5\' 5"  (1.651 m)   Wt 80.7 kg   SpO2 98%  BMI 29.62 kg/m   Physical Exam Vitals and nursing note reviewed.  Constitutional:      General: She is not in acute distress.    Appearance: Normal appearance.  HENT:     Head: Normocephalic and atraumatic.  Eyes:     General:        Right eye: No discharge.        Left eye: No discharge.  Cardiovascular:     Rate and Rhythm: Normal rate and regular rhythm.     Heart sounds: No murmur heard.   No friction rub. No gallop.  Pulmonary:     Effort: Pulmonary effort is normal.     Breath sounds: Normal breath sounds.  Abdominal:     General: Bowel sounds are normal.     Palpations: Abdomen is soft.  Musculoskeletal:     Cervical back: No rigidity.  Skin:    General: Skin is warm and dry.     Capillary Refill: Capillary refill takes less than 2 seconds.  Neurological:     Mental  Status: She is alert and oriented to person, place, and time.     GCS: GCS eye subscore is 4. GCS verbal subscore is 5. GCS motor subscore is 6.     Cranial Nerves: No cranial nerve deficit.     Sensory: No sensory deficit.     Motor: No weakness.     Coordination: Romberg sign negative. Coordination normal.     Gait: Gait normal.     Comments: Intact finger-nose, heel-to-shin.  And oriented x3.  No facial droop, no dysarthria.  Cranial nerves III through XII grossly intact.  Psychiatric:        Mood and Affect: Mood normal.        Speech: Speech normal.        Behavior: Behavior normal.    ED Results / Procedures / Treatments   Labs (all labs ordered are listed, but only abnormal results are displayed) Labs Reviewed  BASIC METABOLIC PANEL  CBC  PREGNANCY, URINE  TROPONIN I (HIGH SENSITIVITY)  TROPONIN I (HIGH SENSITIVITY)    EKG None  Radiology DG Chest 2 View  Result Date: 06/14/2021 CLINICAL DATA:  Chest pain.  Right facial numbness.  Dizziness. EXAM: CHEST - 2 VIEW COMPARISON:  06/28/2019 FINDINGS: Heart size is normal. Mediastinal shadows are normal. The lungs are clear. No bronchial thickening. No infiltrate, mass, effusion or collapse. Pulmonary vascularity is normal. No bony abnormality. IMPRESSION: Normal chest Electronically Signed   By: Paulina Fusi M.D.   On: 06/14/2021 10:47    Procedures Procedures   Medications Ordered in ED Medications - No data to display  ED Course  I have reviewed the triage vital signs and the nursing notes.  Pertinent labs & imaging results that were available during my care of the patient were reviewed by me and considered in my medical decision making (see chart for details).    MDM Rules/Calculators/A&P                         Patient with transient sensory deficit to the right side of the face, spontaneously resolved within 1 minute.  Patient does not have a history of hypercholesterolemia, hypertension, heart disease, carotid  stenosis, or other risk factors for stroke.  Patient has not any coagulopathy.  Patient does not have a history of A. fib.  Minimal concern for TIA, her ABCD 2 score is 0, or 1 if you count  her gestational diabetes.  Given the large differential diagnosis for Silver Achey, the decision making in this case is of high complexity.  After evaluating all of the data points in this case, the presentation of Yasemin Gavel is NOT consistent with Acute Coronary Syndrome (ACS) and/or myocardial ischemia, pulmonary embolism, aortic dissection; Borhaave's, significant arrythmia, pneumothorax, cardiac tamponade, or other emergent cardiopulmonary condition.  Further, the presentation of Danyella Kishi is NOT consistent with pericarditis, myocarditis, cholecystitis, pancreatitis, mediastinitis, endocarditis, new valvular disease.  Additionally, the presentation of Alyssamae Diazis NOT consistent with flail chest, cardiac contusion, ARDS, or significant intra-thoracic or intra-abdominal bleeding.  Moreover, this presentation is NOT consistent with pneumonia, sepsis, or pyelonephritis.  The patient has a negative troponin x2, normal BMP, normal CBC, normal chest x-ray, normal EKG.  Heart score of 1.  Extremely low risk for cardiac or pulmonary cause of her chest pain at this time.  Differential for central sternal chest pain includes anxiety, acid reflux, musculoskeletal pain.  Encouraged follow-up with primary care if symptoms to improve, encouraged to return if chest pain worsens, or patient begins to experience shortness of breath.  Encourage return if patient begins to have additional neurologic deficit or facial numbness.  Strict return and follow-up precautions have been given by me personally or by detailed written instruction given verbally by nursing staff using the teach back method to the patient/family/caregiver(s).  Data Reviewed/Counseling: I have reviewed the patient's vital signs, nursing notes, and other relevant  tests/information. I had a detailed discussion regarding the historical points, exam findings, and any diagnostic results supporting the discharge diagnosis. I also discussed the need for outpatient follow-up and the need to return to the ED if symptoms worsen or if there are any questions or concerns that arise at home.  Final Clinical Impression(s) / ED Diagnoses Final diagnoses:  Chest pain, unspecified type    Rx / DC Orders ED Discharge Orders     None        [place] 06/14/21 1258    Milagros Loll, MD 06/15/21 2035

## 2021-06-14 NOTE — ED Notes (Signed)
[employer] video interpreter in use at this time.  PA at the bedside, will draw blood once their assessment is complete

## 2022-05-03 ENCOUNTER — Other Ambulatory Visit: Payer: Self-pay | Admitting: Hematology and Oncology

## 2022-05-03 DIAGNOSIS — Z124 Encounter for screening for malignant neoplasm of cervix: Secondary | ICD-10-CM

## 2022-05-03 NOTE — Progress Notes (Signed)
Patient: Brooke Howard           Date of Birth: 1991-08-14           MRN: [id number] Visit Date: 05/03/2022 PCP: Patient, No Pcp Per  Cervical Cancer Screening Do you smoke?: No Have you ever had or been told you have an allergy to latex products?: No Marital status: Married Date of last pap smear: 1-2 yrs ago (02/12/21 (ASCUS, + HPV)) Date of last menstrual period:  (IUD) Number of pregnancies: 2 Number of births: 2 Have you ever had any of the following? Hysterectomy: No Tubal ligation (tubes tied): No Abnormal bleeding: No Abnormal pap smear: Yes Venereal warts: No A sex partner with venereal warts: No A high risk* sex partner: No  Cervical Exam  Abnormal Observations: Benign exam Recommendations: 02/12/2021 ASC-US/ +HPV ([facility]), 01/14/2014- Negative per [facility] notes Colposcopy: 04/27/2021 LSIL, CIN-I/ ECC- Scant mucoid inflammatory debris with rare degenerating squamous cells. [facility])  Repeat pap in 6 months      Patient's History Patient Active Problem List   Diagnosis Date Noted   Gestational diabetes 01/31/2017   GDM, class A1 01/31/2017   Supervision of high risk pregnancy, antepartum 01/09/2017   Gestational diabetes mellitus 01/09/2017   Past Medical History:  Diagnosis Date   Acute blood loss anemia 01/07/2015   Infection    kidney infection   postpartum depression     Family History  Problem Relation Age of Onset   Hypertension Maternal Grandmother    Thyroid disease Maternal Grandmother    Hypertension Mother     Social History   Occupational History   Not on file  Tobacco Use   Smoking status: Never   Smokeless tobacco: Never  Vaping Use   Vaping Use: Never used  Substance and Sexual Activity   Alcohol use: No   Drug use: No   Sexual activity: Yes    Birth control/protection: I.U.D.

## 2022-05-06 LAB — CYTOLOGY - PAP
Adequacy: ABSENT
Comment: NEGATIVE
Diagnosis: NEGATIVE
High risk HPV: NEGATIVE

## 2022-05-09 ENCOUNTER — Telehealth: Payer: Self-pay

## 2022-05-09 NOTE — Telephone Encounter (Signed)
Opened in error

## 2022-05-09 NOTE — Telephone Encounter (Signed)
Left message for patient about lab results. Left name and number for patient to call back. 

## 2022-05-09 NOTE — Telephone Encounter (Signed)
Called patient to give pap smear results. Informed patient that pap smear was normal and HPV was negative. Based on this result and her previous hx her next pap smear will be due in 1 year. Patient voiced understanding.
